# Patient Record
Sex: Female | Born: 2000 | Race: White | Hispanic: No | Marital: Single | Smoking: Current every day smoker
Health system: Southern US, Community
[De-identification: ages and names within clinical notes are randomized; demographics above are authoritative.]

## PROBLEM LIST (undated history)

## (undated) DIAGNOSIS — Z803 Family history of malignant neoplasm of breast: Secondary | ICD-10-CM

## (undated) DIAGNOSIS — Z789 Other specified health status: Secondary | ICD-10-CM

## (undated) HISTORY — DX: Family history of malignant neoplasm of breast: Z80.3

## (undated) HISTORY — PX: WISDOM TOOTH EXTRACTION: SHX21

## (undated) HISTORY — DX: Other specified health status: Z78.9

---
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
## 2018-09-27 ENCOUNTER — Ambulatory Visit: Payer: Medicaid Other | Admitting: Nurse Practitioner

## 2018-09-27 ENCOUNTER — Ambulatory Visit: Payer: Self-pay | Admitting: Nurse Practitioner

## 2018-09-30 ENCOUNTER — Encounter: Payer: Self-pay | Admitting: Nurse Practitioner

## 2018-09-30 ENCOUNTER — Ambulatory Visit: Payer: Self-pay | Admitting: Nurse Practitioner

## 2018-09-30 NOTE — Progress Notes (Signed)
Patient was a no-show to her new patient appointment.

## 2018-10-10 ENCOUNTER — Ambulatory Visit: Payer: Medicaid Other | Admitting: Nurse Practitioner

## 2018-10-10 ENCOUNTER — Encounter: Payer: Self-pay | Admitting: Nurse Practitioner

## 2018-10-10 ENCOUNTER — Other Ambulatory Visit: Payer: Self-pay

## 2018-11-14 DIAGNOSIS — N200 Calculus of kidney: Secondary | ICD-10-CM | POA: Diagnosis not present

## 2018-11-14 DIAGNOSIS — R1031 Right lower quadrant pain: Secondary | ICD-10-CM | POA: Diagnosis not present

## 2018-11-14 DIAGNOSIS — R682 Dry mouth, unspecified: Secondary | ICD-10-CM | POA: Diagnosis not present

## 2018-11-14 DIAGNOSIS — R1013 Epigastric pain: Secondary | ICD-10-CM | POA: Diagnosis not present

## 2018-11-14 DIAGNOSIS — Z87891 Personal history of nicotine dependence: Secondary | ICD-10-CM | POA: Diagnosis not present

## 2018-11-14 DIAGNOSIS — R1011 Right upper quadrant pain: Secondary | ICD-10-CM | POA: Diagnosis not present

## 2018-11-14 DIAGNOSIS — K529 Noninfective gastroenteritis and colitis, unspecified: Secondary | ICD-10-CM | POA: Diagnosis not present

## 2018-12-26 ENCOUNTER — Other Ambulatory Visit: Payer: Self-pay

## 2018-12-26 ENCOUNTER — Encounter: Payer: Self-pay | Admitting: Family Medicine

## 2018-12-26 ENCOUNTER — Ambulatory Visit (INDEPENDENT_AMBULATORY_CARE_PROVIDER_SITE_OTHER): Payer: Medicaid Other | Admitting: Family Medicine

## 2018-12-26 VITALS — BP 103/52 | HR 75 | Temp 98.3°F | Ht 66.0 in | Wt 179.4 lb

## 2018-12-26 DIAGNOSIS — Z7689 Persons encountering health services in other specified circumstances: Secondary | ICD-10-CM

## 2018-12-26 DIAGNOSIS — N926 Irregular menstruation, unspecified: Secondary | ICD-10-CM

## 2018-12-26 DIAGNOSIS — Z975 Presence of (intrauterine) contraceptive device: Secondary | ICD-10-CM | POA: Diagnosis not present

## 2018-12-26 DIAGNOSIS — Z308 Encounter for other contraceptive management: Secondary | ICD-10-CM

## 2018-12-26 NOTE — Progress Notes (Signed)
Subjective:    Patient ID: Jennifer Bass, female    DOB: Nov 15, 2000, 18 y.o.   MRN: [id number]  Jennifer Bass is a 18 y.o. female presenting on 12/26/2018 for Establish Care (want discuss birth control, anxiety issues that she think might be related to her birth control that shes currently on.)   HPI   Contraception Management / Abnormal Menstrual Cycle OB GYN History - She had pregnancy at August 2018 and resulted in C-section to deliver her son, 6 week later had Nexplanon placed at health dept. With anticipated removal date of August 2021 - Reports she is interested to get the Nexplanon removed. She describes difficulty with irregular and heavier menstrual cycles, she was placed on OCPs. Then she has just remained on Nexplanon only with still persistent abnormal bleeding, now not as heavy but seems to be more constant and irregular, she is ready to remove Nexplanon and discuss other options - She was concerned about IUD, one of her best friends had a Mirena IUD and had some issues with it, falling out or not working properly. - She is interested in Depo or Mirena or possibly OCP again but had issue adhering to those in past. - She would like to see GYN, previously at [facility]  Additional history Related to prior pregnancy and hormone changes she admits some associated anxiety she believes with Nexplanon and her current course. She says little things trigger this more, but overall thinks this is related. - She says in past she was never formally diagnosed with a postpartum depression but think she had some "down episodes" it occurred later, not immediately after pregnancy, her child is now over 72 years old, and she has done much better, she denies any active depression or mood symptoms. -She does feel overwhelmed at times. Attributes this to anxiety - she is not on anti depressant or anxiety med, and she declines now wants to pursue her contraception plan first  Health Maintenance:  Due for Flu Shot, declines today despite counseling on benefits   Depression screen [facility] 2/9 12/26/2018  Decreased Interest 0  Down, Depressed, Hopeless 0  PHQ - 2 Score 0   GAD 7 : Generalized Anxiety Score 12/26/2018  Nervous, Anxious, on Edge 2  Control/stop worrying 1  Worry too much - different things 1  Trouble relaxing 1  Restless 1  Easily annoyed or irritable 1  Afraid - awful might happen 1  Total GAD 7 Score 8  Anxiety Difficulty Somewhat difficult     History reviewed. No pertinent past medical history. Past Surgical History:  Procedure Laterality Date  . CESAREAN SECTION  09/2016   Social History   Socioeconomic History  . Marital status: Single    Spouse name: Not on file  . Number of children: Not on file  . Years of education: Not on file  . Highest education level: Not on file  Occupational History  . Not on file  Social Needs  . Financial resource strain: Not on file  . Food insecurity    Worry: Not on file    Inability: Not on file  . Transportation needs    Medical: Not on file    Non-medical: Not on file  Tobacco Use  . Smoking status: Never Smoker  . Smokeless tobacco: Never Used  Substance and Sexual Activity  . Alcohol use: No  . Drug use: Yes    Types: Marijuana  . Sexual activity: Not on file  Lifestyle  .  Physical activity    Days per week: Not on file    Minutes per session: Not on file  . Stress: Not on file  Relationships  . Social Musicianconnections    Talks on phone: Not on file    Gets together: Not on file    Attends religious service: Not on file    Active member of club or organization: Not on file    Attends meetings of clubs or organizations: Not on file    Relationship status: Not on file  . Intimate partner violence    Fear of current or ex partner: Not on file    Emotionally abused: Not on file    Physically abused: Not on file    Forced sexual activity: Not on file  Other Topics Concern  . Not on file  Social  History Narrative  . Not on file   History reviewed. No pertinent family history. Current Outpatient Medications on File Prior to Visit  Medication Sig  . etonogestrel (NEXPLANON) 68 MG IMPL implant 1 each by Subdermal route once.   No current facility-administered medications on file prior to visit.     Review of Systems Per HPI unless specifically indicated above      Objective:    BP (!) 103/52 (BP Location: Right Arm, Patient Position: Sitting, Cuff Size: Normal)   Pulse 75   Temp 98.3 F (36.8 C) (Oral)   Ht 5\' 6"  (1.676 m)   Wt 179 lb 6.4 oz (81.4 kg)   BMI 28.96 kg/m   Wt Readings from Last 3 Encounters:  12/26/18 179 lb 6.4 oz (81.4 kg)  04/15/17 160 lb (72.6 kg) (89 %, Z= 1.21)*  10/18/15 160 lb (72.6 kg) (91 %, Z= 1.31)*   * Growth percentiles are based on CDC (Girls, 2-20 Years) data.    Physical Exam Vitals signs and nursing note reviewed.  Constitutional:      General: She is not in acute distress.    Appearance: She is well-developed. She is not diaphoretic.     Comments: Well-appearing, comfortable, cooperative  HENT:     Head: Normocephalic and atraumatic.  Eyes:     General:        Right eye: No discharge.        Left eye: No discharge.     Conjunctiva/sclera: Conjunctivae normal.  Cardiovascular:     Rate and Rhythm: Normal rate.  Pulmonary:     Effort: Pulmonary effort is normal.  Skin:    General: Skin is warm and dry.     Findings: No erythema or rash.  Neurological:     Mental Status: She is alert and oriented to person, place, and time.  Psychiatric:        Behavior: Behavior normal.     Comments: Well groomed, good eye contact, normal speech and thoughts    Results for orders placed or performed during the hospital encounter of 10/18/15  Urinalysis complete, with microscopic  Result Value Ref Range   Color, Urine YELLOW (A) YELLOW   APPearance CLEAR (A) CLEAR   Glucose, UA NEGATIVE NEGATIVE mg/dL   Bilirubin Urine NEGATIVE  NEGATIVE   Ketones, ur 2+ (A) NEGATIVE mg/dL   Specific Gravity, Urine 1.026 1.005 - 1.030   Hgb urine dipstick NEGATIVE NEGATIVE   pH 5.0 5.0 - 8.0   Protein, ur 30 (A) NEGATIVE mg/dL   Nitrite NEGATIVE NEGATIVE   Leukocytes, UA NEGATIVE NEGATIVE   RBC / HPF 0-5 0 - 5 RBC/hpf  WBC, UA 0-5 0 - 5 WBC/hpf   Bacteria, UA NONE SEEN NONE SEEN   Squamous Epithelial / LPF 0-5 (A) NONE SEEN   Mucus PRESENT   Pregnancy, urine POC  Result Value Ref Range   Preg Test, Ur NEGATIVE NEGATIVE      Assessment & Plan:   Problem List Items Addressed This Visit    None    Visit Diagnoses    Abnormal menstrual cycle    -  Primary   Relevant Orders   Ambulatory referral to Obstetrics / Gynecology   Encounter for other contraceptive management       Relevant Orders   Ambulatory referral to Obstetrics / Gynecology   Encounter to establish care with new doctor       Nexplanon in place       Relevant Orders   Ambulatory referral to Obstetrics / Gynecology      Referral to [facility] (Preferred female provider) - for contraception management, age 18 year old female prior pregnancy at 65 C-section, she had Nexplanon in 09/2016, has had irregular menstrual and heavy bleeding and some hormonal mood swings/anxiety on this, and she requests removal of Nexplanon implant (was [facility]), and also she would like to discuss other contraception options such as Depo vs Mirena IUD   Recommend IUD for her given her goals, including regulating or reducing menstrual cycle bleeding. Also least side effect most effective and no adherence issues. She will consider this option.  Clinically with some mild associated anxiety uncertain if this is triggered by prior pregnancy or postpartum period adjustment / life stressors currently. Counseling provided on postpartum depression / general anxiety, and reviewed recommendations to be aware of these issues and bring them to our attention in future if  any changes or new concerns, and discussed that after management on contraception if she is ready to pursue any mood or anxiety related concerns we can re-discuss - Also can review with her new GYN as well if it is linked to postpartum period  Orders Placed This Encounter  Procedures  . Ambulatory referral to Obstetrics / Gynecology    Referral Priority:   Routine    Referral Type:   Consultation    Referral Reason:   Specialty Services Required    Requested Specialty:   Obstetrics and Gynecology    Number of Visits Requested:   1     No orders of the defined types were placed in this encounter.    Follow up plan: Return in about 1 year (around 12/26/2019) for Annual Physical.   Future scheduling TBD  Nobie Putnam, DO [facility] 12/26/2018, 10:34 AM

## 2018-12-26 NOTE — Patient Instructions (Addendum)
Thank you for coming to the office today.  Referral sent to GYN - stay tuned for phone call w/ apt - can call them or [place] in 1 week if not heard back.  I do recommend IUD Mirena or similar IUD options - it has least side effects and would be best to control or regular your menstrual cycle, they can discuss all options including Depo as well.   [facility]   Address: [address], [place], [place], [place], [place] [address] Hours: 8AM-5PM Phone: [phone]  Please schedule a Follow-up Appointment to: Return in about 1 year (around 12/26/2019) for Annual Physical.  If you have any other questions or concerns, please feel free to call the office or send a message through [facility]. You may also schedule an earlier appointment if necessary.  Additionally, you may be receiving a survey about your experience at our office within a few days to 1 week by e-mail or mail. We value your feedback.  Nobie Putnam, DO [facility]

## 2019-01-11 NOTE — Progress Notes (Signed)
Jennifer Hauser, Jennifer Bass   Chief Complaint  Patient presents with  . Contraception    Nexplanon removal, interested in [facility]    HPI:      Jennifer Bass is a 18 y.o. No obstetric history on file. who LMP was No LMP recorded. Patient has had an implant., presents today for NP [facility] consult and nexplanon removal, referred by PCP. Nexplanon placed PP 8/18; irreg bleeding since. Tried BCP to control cycle without relief. Would like it removed and wants to try something else. Did OCPs in past but forgot to take daily and got pregnant. No hx of HTN, DVTs, migraines with aura. She is occas sex active.   There are no active problems to display for this patient.   Past Surgical History:  Procedure Laterality Date  . CESAREAN SECTION  09/2016  . WISDOM TOOTH EXTRACTION      Family History  Problem Relation Age of Onset  . Breast cancer Maternal Aunt        65s  . Breast cancer Other        2 Aunts, both deceased  . Breast cancer Maternal Great-grandfather     Social History   Socioeconomic History  . Marital status: Single    Spouse name: Not on file  . Number of children: Not on file  . Years of education: Not on file  . Highest education level: Not on file  Occupational History  . Not on file  Social Needs  . Financial resource strain: Not on file  . Food insecurity    Worry: Not on file    Inability: Not on file  . Transportation needs    Medical: Not on file    Non-medical: Not on file  Tobacco Use  . Smoking status: Never Smoker  . Smokeless tobacco: Never Used  Substance and Sexual Activity  . Alcohol use: No  . Drug use: Yes    Types: Marijuana  . Sexual activity: Yes    Birth control/protection: Implant  Lifestyle  . Physical activity    Days per week: Not on file    Minutes per session: Not on file  . Stress: Not on file  Relationships  . Social Herbalist on phone: Not on file    Gets together: Not on file    Attends religious  service: Not on file    Active member of club or organization: Not on file    Attends meetings of clubs or organizations: Not on file    Relationship status: Not on file  . Intimate partner violence    Fear of current or ex partner: Not on file    Emotionally abused: Not on file    Physically abused: Not on file    Forced sexual activity: Not on file  Other Topics Concern  . Not on file  Social History Narrative  . Not on file    Outpatient Medications Prior to Visit  Medication Sig Dispense Refill  . etonogestrel (NEXPLANON) 68 MG IMPL implant 1 each by Subdermal route once.     No facility-administered medications prior to visit.       ROS:  Review of Systems  Constitutional: Negative for fever.  Gastrointestinal: Negative for blood in stool, constipation, diarrhea, nausea and vomiting.  Genitourinary: Positive for vaginal bleeding. Negative for dyspareunia, dysuria, flank pain, frequency, hematuria, urgency, vaginal discharge and vaginal pain.  Musculoskeletal: Negative for back pain.  Skin: Negative for rash.  BREAST: No symptoms   OBJECTIVE:   Vitals:  BP 100/60   Ht 5\' 6"  (1.676 m)   Wt 180 lb (81.6 kg)   BMI 29.05 kg/m   Physical Exam Vitals signs reviewed.  Constitutional:      Appearance: She is well-developed.  Neck:     Musculoskeletal: Normal range of motion.  Pulmonary:     Effort: Pulmonary effort is normal.  Musculoskeletal: Normal range of motion.  Skin:    General: Skin is warm and dry.  Neurological:     General: No focal deficit present.     Mental Status: She is alert and oriented to person, place, and time.     Cranial Nerves: No cranial nerve deficit.  Psychiatric:        Mood and Affect: Mood normal.        Behavior: Behavior normal.        Thought Content: Thought content normal.        Judgment: Judgment normal.    Nexplanon removal Procedure note - The Nexplanon was noted in the patient's arm and the end was identified. The  skin was cleansed with a Betadine solution. A small injection of subcutaneous lidocaine with epinephrine was given over the end of the implant. An incision was made at the end of the implant. The rod was noted in the incision and grasped with a hemostat. It was noted to be intact.  Steri-Strip was placed approximating the incision. Hemostasis was noted.   Assessment/Plan: Encounter for initial prescription of injectable contraceptive - Plan: medroxyPROGESTERone Acetate 150 MG/ML SUSY; BC options discussed. Pt would like to try depo. Also considered xulane. Rx eRxd. RTO for injection after neg UPT. Condoms.   Nexplanon removal   Meds ordered this encounter  Medications  . medroxyPROGESTERone Acetate 150 MG/ML SUSY    Sig: Inject 1 mL (150 mg total) into the muscle once for 1 dose.    Dispense:  1 mL    Refill:  3    Order Specific Question:   Supervising Provider    Answer:   Nadara Mustard    Plan:   She was told to remove the dressing in 12-24 hours, to keep the incision area dry for 24 hours and to remove the Steristrip in 2-3  days.  Notify [birth year]-1992 if any signs of tenderness, redness, pain, or fevers develop.    Return if symptoms worsen or fail to improve.  Zeddie Njie B. Daelyn Pettaway, PA-C 01/12/2019 10:02 AM

## 2019-01-11 NOTE — Patient Instructions (Addendum)
I value your feedback and entrusting us with your care. If you get a [facility] patient survey, I would appreciate you taking the time to let us know about your experience today. Thank you!  Remove the dressing in 24 hours,  keep the incision area dry for 24 hours and remove the Steristrip in 2-3  days.  Notify us if any signs of tenderness, redness, pain, or fevers develop.  

## 2019-01-12 ENCOUNTER — Ambulatory Visit (INDEPENDENT_AMBULATORY_CARE_PROVIDER_SITE_OTHER): Payer: Medicaid Other | Admitting: Obstetrics and Gynecology

## 2019-01-12 ENCOUNTER — Encounter: Payer: Self-pay | Admitting: Obstetrics and Gynecology

## 2019-01-12 ENCOUNTER — Other Ambulatory Visit: Payer: Self-pay

## 2019-01-12 VITALS — BP 100/60 | Ht 66.0 in | Wt 180.0 lb

## 2019-01-12 DIAGNOSIS — Z3046 Encounter for surveillance of implantable subdermal contraceptive: Secondary | ICD-10-CM

## 2019-01-12 DIAGNOSIS — Z3049 Encounter for surveillance of other contraceptives: Secondary | ICD-10-CM

## 2019-01-12 DIAGNOSIS — Z30013 Encounter for initial prescription of injectable contraceptive: Secondary | ICD-10-CM

## 2019-01-12 MED ORDER — MEDROXYPROGESTERONE ACETATE 150 MG/ML IM SUSY
150.0000 mg | PREFILLED_SYRINGE | Freq: Once | INTRAMUSCULAR | 3 refills | Status: DC
Start: 2019-01-12 — End: 2021-08-11

## 2019-01-17 ENCOUNTER — Telehealth: Payer: Self-pay

## 2019-01-17 NOTE — Telephone Encounter (Signed)
Called pt back, advised to yes definitely take the bandaid off. She mentioned steri strips came off along with big bandaid. The cramping she mentioned she normally has with her period, which she still is. Has noticed her period is a little heavier than usual. Advised a really heavy period where she has to change her tampon more than once in less than a hour, she should contact us or head to ER.

## 2019-01-17 NOTE — Telephone Encounter (Signed)
Pt called after hour nurse 01/16/19 10:47pm stating she jut had her nexplanon removed; wants to know if she has to change the bandaid; is having cramping too and is wanting to make sure that is normal.  [phone]

## 2019-02-25 ENCOUNTER — Ambulatory Visit
Admission: EM | Admit: 2019-02-25 | Discharge: 2019-02-25 | Disposition: A | Discharge status: Home / Self Care | Payer: Medicaid Other | Attending: Emergency Medicine | Admitting: Emergency Medicine

## 2019-02-25 ENCOUNTER — Other Ambulatory Visit: Payer: Self-pay

## 2019-02-25 DIAGNOSIS — Z20822 Contact with and (suspected) exposure to covid-19: Secondary | ICD-10-CM | POA: Diagnosis not present

## 2019-02-25 NOTE — Discharge Instructions (Addendum)
Advised to retest in 3 to 4 days to make sure that you do not have Covid.  Sometimes the test does not turn out positive until 5 to 8 days post exposure.

## 2019-02-25 NOTE — ED Provider Notes (Signed)
HPI  SUBJECTIVE:  Jennifer Bass is a 19 y.o. female who presents for Covid testing.  Last known exposure was 3 days ago.  She has no complaints.  She denies fevers, body aches, headaches, nasal congestion, sore throat, fatigue, cough, shortness of breath, nausea, vomiting, diarrhea, abdominal pain.  She vapes.  No tobacco use.  No pulmonary disease, coronary disease, diabetes, hypertension, chronic kidney disease, HIV, cancer, immunocompromise.  PMD: [facility].    Past Medical History:  Diagnosis Date  . Family history of breast cancer    qualifies for cancer genetic testing age 77    Past Surgical History:  Procedure Laterality Date  . CESAREAN SECTION  09/2016  . WISDOM TOOTH EXTRACTION      Family History  Problem Relation Age of Onset  . Breast cancer Maternal Aunt        30s  . Breast cancer Other        2 Aunts, both deceased  . Breast cancer Maternal Great-grandfather     Social History   Tobacco Use  . Smoking status: Never Smoker  . Smokeless tobacco: Never Used  Substance Use Topics  . Alcohol use: No  . Drug use: Yes    Types: Marijuana    No current facility-administered medications for this encounter.  Current Outpatient Medications:  .  medroxyPROGESTERone Acetate 150 MG/ML SUSY, Inject 1 mL (150 mg total) into the muscle once for 1 dose., Disp: 1 mL, Rfl: 3  Allergies  Allergen Reactions  . Hydrocodone Rash     ROS  As noted in HPI.   Physical Exam  BP (!) 136/100 (BP Location: Left Arm)   Pulse 82   Temp 98.3 F (36.8 C) (Oral)   Resp 18   Ht 5\' 6"  (1.676 m)   Wt 77.1 kg   LMP 02/04/2019   SpO2 100%   BMI 27.44 kg/m   Constitutional: Well developed, well nourished, no acute distress Eyes:  EOMI, conjunctiva normal bilaterally HENT: Normocephalic, atraumatic,mucus membranes moist Respiratory: Normal inspiratory effort lungs clear bilaterally Cardiovascular: Normal rate regular rhythm no murmurs rubs or  gallops GI: nondistended skin: No rash, skin intact Musculoskeletal: no deformities Neurologic: Alert & oriented x 3, no focal neuro deficits Psychiatric: Speech and behavior appropriate   ED Course   Medications - No data to display  Orders Placed This Encounter  Procedures  . Novel Coronavirus, NAA (Hosp order, Send-out to Ref Lab; TAT 18-24 hrs    Standing Status:   Standing    Number of Occurrences:   1    Order Specific Question:   Is this test for diagnosis or screening    Answer:   Screening    Order Specific Question:   Symptomatic for COVID-19 as defined by CDC    Answer:   No    Order Specific Question:   Hospitalized for COVID-19    Answer:   No    Order Specific Question:   Admitted to ICU for COVID-19    Answer:   No    Order Specific Question:   Previously tested for COVID-19    Answer:   No    Order Specific Question:   Resident in a congregate (group) care setting    Answer:   No    Order Specific Question:   Employed in healthcare setting    Answer:   No    Order Specific Question:   Pregnant    Answer:   No  No results found for this or any previous visit (from the past 24 hour(s)). No results found.  ED Clinical Impression  1. Close exposure to COVID-19 virus   2. Encounter for laboratory testing for COVID-19 virus      ED Assessment/Plan  Covid PCR sent.  Patient asymptomatic.  Advised patient it may be too early and that this may be a false negative.  Advised her to retest in 4 to 5 days, seek medical care sooner if she becomes symptomatic.  sNo orders of the defined types were placed in this encounter.   *This clinic note was created using Dragon dictation software. Therefore, there may be occasional mistakes despite careful proofreading.   ?    Melynda Ripple, MD 02/26/19 [phone]

## 2019-02-25 NOTE — ED Triage Notes (Signed)
Patient states that she is here for Covid Testing. Reports that one of her co-workers is positive. Patient currently with no symptoms.

## 2019-02-26 LAB — NOVEL CORONAVIRUS, NAA (HOSP ORDER, SEND-OUT TO REF LAB; TAT 18-24 HRS): SARS-CoV-2, NAA: NOT DETECTED

## 2019-05-31 ENCOUNTER — Other Ambulatory Visit: Payer: Self-pay

## 2019-05-31 ENCOUNTER — Ambulatory Visit (INDEPENDENT_AMBULATORY_CARE_PROVIDER_SITE_OTHER): Payer: Medicaid Other | Admitting: Family Medicine

## 2019-05-31 ENCOUNTER — Encounter: Payer: Self-pay | Admitting: Family Medicine

## 2019-05-31 ENCOUNTER — Ambulatory Visit: Payer: Self-pay | Admitting: *Deleted

## 2019-05-31 VITALS — BP 82/52 | HR 85 | Temp 97.5°F | Resp 16 | Ht 66.0 in | Wt 170.0 lb

## 2019-05-31 DIAGNOSIS — L239 Allergic contact dermatitis, unspecified cause: Secondary | ICD-10-CM

## 2019-05-31 MED ORDER — TRIAMCINOLONE ACETONIDE 0.5 % EX CREA
1.0000 | TOPICAL_CREAM | Freq: Two times a day (BID) | CUTANEOUS | 1 refills | Status: DC
Start: 2019-05-31 — End: 2021-01-13

## 2019-05-31 MED ORDER — HYDROXYZINE HCL 10 MG PO TABS: 10.0000 mg | ORAL_TABLET | Freq: Three times a day (TID) | ORAL | 1 refills | Status: DC | PRN
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
## 2019-05-31 NOTE — Patient Instructions (Addendum)
Thank you for coming to the office today.  Irritated red skin can be from allergic reaction / chemical burn from product  Use the topical steroid creams twice a day for up to 1 week, maximum duration of use per one flare is 10 to 14 days, then STOP using it and allow skin to recover. Caution with over-use may cause lightening of the skin.   For baths/showers, limit bathing to every other day if you can (max 1 x daily)  Use a gentle, unscented soap and lukewarm water (hot water is most irritating to skin) Never scrub skin with too much pressure, this causes more irritation. Pat skin dry, then leave it slightly damp. DO NOT scrub it dry. Apply steroid cream to skin and rub in all the way, wait 15 min, then apply a daily moisturizer (Vaseline, Eucerin, Aveeno). Continue daily moisturizer every day of the year (even after flare is resolved)  Use anti itch pill hydroxyzine as needed 1-2 pills every 8 hours or 3 times a day.  MAY also use a topical antibiotic ointment - neosporin if needed throughout the day - but only use AFTER the steroid is absorbed into scalp.  IF NOT IMPROVED in 48 hours - call or message and we can consider oral steroid prednisone or oral antibiotic if signs of infection.  If develops redness, honey colored crust oozing, drainage of pus, bleeding, or redness / swelling, pain, please return for re-evaluation, may have become infected after scratching.   Please schedule a Follow-up Appointment to: Return in about 2 days (around 06/02/2019), or if symptoms worsen or fail to improve, for allergic reaction.  If you have any other questions or concerns, please feel free to call the office or send a message through MyChart. You may also schedule an earlier appointment if necessary.  Additionally, you may be receiving a survey about your experience at our office within a few days to 1 week by e-mail or mail. We value your feedback.  Saralyn Pilar, DO [facility], [place]
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
## 2019-07-31 ENCOUNTER — Ambulatory Visit: Payer: Medicaid Other | Admitting: Family Medicine

## 2019-08-04 ENCOUNTER — Telehealth: Payer: Medicaid Other

## 2019-10-27 ENCOUNTER — Other Ambulatory Visit: Payer: Self-pay

## 2019-10-27 ENCOUNTER — Telehealth (INDEPENDENT_AMBULATORY_CARE_PROVIDER_SITE_OTHER): Payer: Medicaid Other | Admitting: Family Medicine

## 2019-10-27 ENCOUNTER — Encounter: Payer: Self-pay | Admitting: Family Medicine

## 2019-10-27 DIAGNOSIS — J019 Acute sinusitis, unspecified: Secondary | ICD-10-CM

## 2019-10-27 DIAGNOSIS — Z20822 Contact with and (suspected) exposure to covid-19: Secondary | ICD-10-CM

## 2019-10-27 MED ORDER — IPRATROPIUM BROMIDE 0.06 % NA SOLN
2.0000 | Freq: Four times a day (QID) | NASAL | 0 refills | Status: DC
Start: 2019-10-27 — End: 2019-11-06

## 2019-10-27 NOTE — Patient Instructions (Addendum)
Start Atrovent nasal spray decongestant 2 sprays in each nostril up to 4 times daily for 7 days  COVID19 swab here at office today 4:15pm  Stay tuned, recommend quarantine as advised  Follow up if not resolved by next week can treat further as sinus infection if not improve  Please schedule a Follow-up Appointment to: Return in about 1 week (around 11/03/2019), or if symptoms worsen or fail to improve, for sinusitis / vs covid.  If you have any other questions or concerns, please feel free to call the office or send a message through MyChart. You may also schedule an earlier appointment if necessary.  Additionally, you may be receiving a survey about your experience at our office within a few days to 1 week by e-mail or mail. We value your feedback.  Saralyn Pilar, DO [facility], [place]

## 2019-10-27 NOTE — Progress Notes (Signed)
Subjective:    Patient ID: Jennifer Bass, female    DOB: 2000/08/26, 19 y.o.   MRN: [id number]  Jennifer Bass is a 19 y.o. female presenting on 10/27/2019 for Sore Throat (nasal congestion, cough  onset two days denies fever --SOB--or chills or bodyache)  Virtual / Telehealth Encounter - Video Visit via MyChart The purpose of this virtual visit is to provide medical care while limiting exposure to the novel coronavirus (COVID19) for both patient and office staff.  Consent was obtained for remote visit:  Yes.   Answered questions that patient had about telehealth interaction:  Yes.   I discussed the limitations, risks, security and privacy concerns of performing an evaluation and management service by video/telephone. I also discussed with the patient that there may be a patient responsible charge related to this service. The patient expressed understanding and agreed to proceed.  Patient Location: Home Provider Location: [facility] (Office)   HPI   Acute Rhinosinusitis Possible COVID19 infection Sore throat  Reports symptoms started few days ago with sore throat sinus drainage causing post nasal drainage, gradual worse - Took OTC cold medicine PRN some temporary relief. Takes DayQuil and Allergy nasal spray - Currently at home with baby, asymptomatic No other fam member sick Denies any dyspnea, coughing, wheezing, headache, change or loss of taste / smell, nausea vomiting diarrhea  Health Maintenance: She is not vaccinated against COVID19.  Depression screen PHQ 2/9 12/26/2018  Decreased Interest 0  Down, Depressed, Hopeless 0  PHQ - 2 Score 0    Social History   Tobacco Use  . Smoking status: Never Smoker  . Smokeless tobacco: Never Used  Vaping Use  . Vaping Use: Every day  . Substances: Nicotine, Flavoring  Substance Use Topics  . Alcohol use: No  . Drug use: Yes    Types: Marijuana    Review of Systems Per HPI unless specifically indicated  above     Objective:    There were no vitals taken for this visit.  Wt Readings from Last 3 Encounters:  05/31/19 170 lb (77.1 kg)  02/25/19 170 lb (77.1 kg)  01/12/19 180 lb (81.6 kg)    Physical Exam   Note examination was completely remotely via video observation objective data only  Gen - well-appearing, no acute distress or apparent pain, comfortable HEENT - eyes appear clear without discharge or redness Heart/Lungs - cannot examine virtually - observed no evidence of coughing or labored breathing. Skin - face visible today- no rash Neuro - awake, alert, oriented Psych - not anxious appearing   Results for orders placed or performed during the hospital encounter of 02/25/19  Novel Coronavirus, NAA (Hosp order, Send-out to Ref Lab; TAT 18-24 hrs   Specimen: Nasopharyngeal Swab; Respiratory  Result Value Ref Range   SARS-CoV-2, NAA NOT DETECTED NOT DETECTED   Coronavirus Source NASOPHARYNGEAL       Assessment & Plan:   Problem List Items Addressed This Visit    None    Visit Diagnoses    Acute rhinosinusitis    -  Primary   Relevant Medications   ipratropium (ATROVENT) 0.06 % nasal spray   Suspected COVID-19 virus infection       Relevant Orders   Novel Coronavirus, NAA ([facility])      Consistent with acute frontal rhinosinusitis, likely initially viral URI vs allergic rhinitis component Unvaccinated to COVID, at risk for infection cannot rule out. No other sick contacts Afebrile, no dyspnea  Plan:  1. Reassurance, likely self-limited - no indication for antibiotics at this time 2. Start Atrovent nasal spray decongestant 2 sprays in each nostril up to 4 times daily for 7 days 3. Continue cold medicine OTC, allergy meds, CAUTION with Mucinex/Afrin nasal spray limit overuse avoid rebound  COVID Swab [facility] - ordered today, return to [facility] 4:15pm today for drive up covid swab  Return criteria reviewed  If unresolved by next week, call or message, we can  consider other treatment for sinusitis such as antibiotic if unresolved.   Orders Placed This Encounter  Procedures  . Novel Coronavirus, NAA ([facility])    Order Specific Question:   Is this test for diagnosis or screening    Answer:   Diagnosis of ill patient    Order Specific Question:   Symptomatic for COVID-19 as defined by CDC    Answer:   Yes    Order Specific Question:   Date of Symptom Onset    Answer:   10/25/2019    Order Specific Question:   Hospitalized for COVID-19    Answer:   No    Order Specific Question:   Admitted to ICU for COVID-19    Answer:   No    Order Specific Question:   Previously tested for COVID-19    Answer:   No    Order Specific Question:   Resident in a congregate (group) care setting    Answer:   No    Order Specific Question:   Is the patient student?    Answer:   No    Order Specific Question:   Employed in healthcare setting    Answer:   No    Order Specific Question:   Pregnant    Answer:   No    Order Specific Question:   Has patient completed COVID vaccination(s) (2 doses of Pfizer/Moderna 1 dose of [employer])    Answer:   No    Order Specific Question:   Release to patient    Answer:   Immediate     Meds ordered this encounter  Medications  . ipratropium (ATROVENT) 0.06 % nasal spray    Sig: Place 2 sprays into both nostrils 4 (four) times daily. For up to 5-7 days then stop.    Dispense:  15 mL    Refill:  0     Follow up plan: Return in about 1 week (around 11/03/2019), or if symptoms worsen or fail to improve, for sinusitis / vs covid.    Patient verbalizes understanding with the above medical recommendations including the limitation of remote medical advice.  Specific follow-up and call-back criteria were given for patient to follow-up or seek medical care more urgently if needed.  Total duration of direct patient care provided via video conference: 10 minutes  Saralyn Pilar, DO [facility] 10/27/2019, 9:48 AM

## 2019-11-06 ENCOUNTER — Telehealth: Payer: Self-pay | Admitting: Family Medicine

## 2019-11-06 DIAGNOSIS — J019 Acute sinusitis, unspecified: Secondary | ICD-10-CM

## 2019-11-06 MED ORDER — PREDNISONE 20 MG PO TABS: 40.0000 mg | ORAL_TABLET | Freq: Every day | ORAL | 0 refills | Status: DC

## 2019-11-06 NOTE — Telephone Encounter (Signed)
Pt called stating that she is experiencing sinus pressure. She states that since her visit with PCP on 10/1719 she has felt a lot better, but the sinus pressure in her ears will not go away. Pt is requesting to have something sent in for her or to talk to a nurse or PCP. Please advise.       [facility] - [place], [place] - [address]  [address] [place] [address]  Phone: [phone] Fax: [phone]  Hours: Not open 24 hours

## 2019-11-06 NOTE — Telephone Encounter (Signed)
Called her. Will send prednisone 20mg  x 2 = 40mg  daily x 3 days with meal for sinus pressure, eustachian tube dysfunction pressure, no sign bacterial sinusitis at this time, she declines nasal steroid  , DO [facility] [place] [facility] 11/06/2019, 6:21 PM

## 2020-01-12 ENCOUNTER — Ambulatory Visit: Payer: Self-pay

## 2020-02-11 ENCOUNTER — Other Ambulatory Visit: Payer: Self-pay

## 2020-02-11 ENCOUNTER — Encounter: Payer: Self-pay | Admitting: Emergency Medicine

## 2020-02-11 DIAGNOSIS — F419 Anxiety disorder, unspecified: Secondary | ICD-10-CM | POA: Diagnosis not present
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
## 2020-02-12 ENCOUNTER — Emergency Department
Admission: EM | Admit: 2020-02-12 | Discharge: 2020-02-12 | Disposition: A | Discharge status: Home / Self Care | Payer: Medicaid Other | Attending: Emergency Medicine | Admitting: Emergency Medicine

## 2020-02-12 DIAGNOSIS — F419 Anxiety disorder, unspecified: Secondary | ICD-10-CM

## 2020-02-12 MED ORDER — LORAZEPAM 1 MG PO TABS: 1.0000 mg | ORAL_TABLET | Freq: Three times a day (TID) | ORAL | 0 refills | Status: DC | PRN

## 2020-02-12 MED ORDER — LORAZEPAM 1 MG PO TABS
1.0000 mg | ORAL_TABLET | Freq: Once | ORAL | Status: DC
  Filled 2020-02-12: qty 1

## 2020-02-12 NOTE — ED Provider Notes (Signed)
[facility] Emergency Department Provider Note   ____________________________________________   Event Date/Time   First MD Initiated Contact with Patient 02/12/20 [phone]     (approximate)  I have reviewed the triage vital signs and the nursing notes.   HISTORY  Chief Complaint Anxiety    HPI Jennifer Bass is a 20 y.o. female who presents to the ED from home with a chief complaint of anxiety.  Patient has a history of anxiety, not currently taking medications nor seeing a counselor.  She has a particular fear of thunderstorms.  She was alone tonight when a thunderstorm started which made her panic.  Complains of nausea associated with anxiety but that has since resolved.  Denies fever, cough, chest pain, shortness of breath, abdominal pain, nausea, vomiting or dizziness.  Denies active SI/HI/AH/VH.     Past Medical History:  Diagnosis Date  . Family history of breast cancer    qualifies for cancer genetic testing age 57    There are no problems to display for this patient.   Past Surgical History:  Procedure Laterality Date  . CESAREAN SECTION  09/2016  . WISDOM TOOTH EXTRACTION      Prior to Admission medications   Medication Sig Start Date End Date Taking? Authorizing Provider  LORazepam (ATIVAN) 1 MG tablet Take 1 tablet (1 mg total) by mouth every 8 (eight) hours as needed for anxiety. 02/12/20  Yes Irean Hong, MD  hydrOXYzine (ATARAX/VISTARIL) 10 MG tablet Take 1-2 tablets (10-20 mg total) by mouth every 8 (eight) hours as needed for itching. 05/31/19   Althea Charon, Netta Neat, DO  medroxyPROGESTERone Acetate 150 MG/ML SUSY Inject 1 mL (150 mg total) into the muscle once for 1 dose. 01/12/19 01/12/19  Copland, Ilona Sorrel, PA-C  predniSONE (DELTASONE) 20 MG tablet Take 2 tablets (40 mg total) by mouth daily with breakfast. For 3 days 11/06/19   Smitty Cords, DO  triamcinolone cream (KENALOG) 0.5 % Apply 1 application topically 2 (two)  times daily. To affected areas, for up to 2 weeks. 05/31/19   Karamalegos, Netta Neat, DO  etonogestrel (NEXPLANON) 68 MG IMPL implant 1 each by Subdermal route once.  02/25/19  [provider]    Allergies Hydrocodone  Family History  Problem Relation Age of Onset  . Breast cancer Maternal Aunt        30s  . Breast cancer Other        2 Aunts, both deceased  . Breast cancer Maternal Great-grandfather     Social History Social History   Tobacco Use  . Smoking status: Never Smoker  . Smokeless tobacco: Never Used  Vaping Use  . Vaping Use: Every day  . Substances: Nicotine, Flavoring  Substance Use Topics  . Alcohol use: Yes    Comment: occ  . Drug use: Yes    Types: Marijuana    Review of Systems  Constitutional: No fever/chills Eyes: No visual changes. ENT: No sore throat. Cardiovascular: Denies chest pain. Respiratory: Denies shortness of breath. Gastrointestinal: No abdominal pain.  No nausea, no vomiting.  No diarrhea.  No constipation. Genitourinary: Negative for dysuria. Musculoskeletal: Negative for back pain. Skin: Negative for rash. Neurological: Negative for headaches, focal weakness or numbness. Psychiatric:  Positive for anxiety.  ____________________________________________   PHYSICAL EXAM:  VITAL SIGNS: ED Triage Vitals  Enc Vitals Group     BP 02/11/20 2209 118/62     Pulse Rate 02/11/20 2209 (!) 54     Resp  02/11/20 2209 18     Temp 02/11/20 2209 97.7 F (36.5 C)     Temp Source 02/11/20 2209 Oral     SpO2 02/11/20 2209 99 %     Weight 02/11/20 2210 175 lb (79.4 kg)     Height 02/11/20 2210 5\' 6"  (1.676 m)     Head Circumference --      Peak Flow --      Pain Score 02/11/20 2210 7     Pain Loc --      Pain Edu? --      Excl. in GC? --     Constitutional: Alert and oriented. Well appearing and in no acute distress. Eyes: Conjunctivae are normal. PERRL. EOMI. Head: Atraumatic. Nose: No  congestion/rhinnorhea. Mouth/Throat: Mucous membranes are moist.   Neck: No stridor.   Cardiovascular: Normal rate, regular rhythm. Grossly normal heart sounds.  Good peripheral circulation. Respiratory: Normal respiratory effort.  No retractions. Lungs CTAB. Gastrointestinal: Soft and nontender. No distention. No abdominal bruits. No CVA tenderness. Musculoskeletal: No lower extremity tenderness nor edema.  No joint effusions. Neurologic:  Normal speech and language. No gross focal neurologic deficits are appreciated. No gait instability. Skin:  Skin is warm, dry and intact. No rash noted. Psychiatric: Mood and affect are mildly anxious. Speech and behavior are normal.  ____________________________________________   LABS (all labs ordered are listed, but only abnormal results are displayed)  Labs Reviewed - No data to display ____________________________________________  EKG  None ____________________________________________  RADIOLOGY I, Cayleigh Paull J, personally viewed and evaluated these images (plain radiographs) as part of my medical decision making, as well as reviewing the written report by the radiologist.  ED MD interpretation: None  Official radiology report(s): No results found.  ____________________________________________   PROCEDURES  Procedure(s) performed (including Critical Care):  Procedures   ____________________________________________   INITIAL IMPRESSION / ASSESSMENT AND PLAN / ED COURSE  As part of my medical decision making, I reviewed the following data within the electronic MEDICAL RECORD NUMBER Nursing notes reviewed and incorporated, Old chart reviewed and Notes from prior ED visits     20 year old female with a history of anxiety presenting with heightened anxiety secondary to thunderstorm.  Voices no complaints of SI/HI/AH/VH.  Will administer and prescribe 1mg  Lorazepam, and refer to [facility] for outpatient follow-up.  Strict return precautions  given.  Patient verbalizes understanding agrees with plan of care      ____________________________________________   FINAL CLINICAL IMPRESSION(S) / ED DIAGNOSES  Final diagnoses:  Anxiety     ED Discharge Orders         Ordered    LORazepam (ATIVAN) 1 MG tablet  Every 8 hours PRN        02/12/20 0100          *Please note:  Liviya Santini Birch was evaluated in Emergency Department on 02/12/2020 for the symptoms described in the history of present illness. She was evaluated in the context of the global COVID-19 pandemic, which necessitated consideration that the patient might be at risk for infection with the SARS-CoV-2 virus that causes COVID-19. Institutional protocols and algorithms that pertain to the evaluation of patients at risk for COVID-19 are in a state of rapid change based on information released by regulatory bodies including the CDC and federal and state organizations. These policies and algorithms were followed during the patient's care in the ED.  Some ED evaluations and interventions may be delayed as a result of limited staffing during and the pandemic.*  Note:  This document was prepared using Dragon voice recognition software and may include unintentional dictation errors.   Paulette Blanch, MD 02/12/20 [phone]

## 2020-02-12 NOTE — Discharge Instructions (Signed)
1.  You may take Lorazepam 1 mg every 8 hours as needed for anxiety. 2.  Return to the ER for worsening symptoms, feelings of hurting yourself or others, or other concerns.

## 2020-02-12 NOTE — ED Notes (Signed)
Pt tearful and does not feel safe at this time to leave. Pt denies SI and HI. Writer and MD agree to have pt stay in recliner until AM for observation of mood/actions.

## 2020-02-13 ENCOUNTER — Telehealth: Payer: Self-pay | Admitting: *Deleted

## 2020-02-13 NOTE — Telephone Encounter (Signed)
Transition Care Management Unsuccessful Follow-up Telephone Call  Date of discharge and from where:  02/12/20 [facility]  Attempts:  1st Attempt  Reason for unsuccessful TCM follow-up call:  No answer/busy

## 2020-02-15 NOTE — Telephone Encounter (Signed)
Transition Care Management Unsuccessful Follow-up Telephone Call  Date of discharge and from where:  02/12/20 [facility] ED  Attempts:  2nd Attempt  Reason for unsuccessful TCM follow-up call:  Unable to reach patient

## 2020-02-16 NOTE — Telephone Encounter (Signed)
Transition Care Management Unsuccessful Follow-up Telephone Call  Date of discharge and from where:  02/12/20 [facility] ED  Attempts:  3rd Attempt  Reason for unsuccessful TCM follow-up call:  Unable to reach patient

## 2020-02-20 ENCOUNTER — Other Ambulatory Visit: Payer: Self-pay

## 2020-02-20 ENCOUNTER — Other Ambulatory Visit: Payer: Medicaid Other

## 2020-02-20 DIAGNOSIS — Z20822 Contact with and (suspected) exposure to covid-19: Secondary | ICD-10-CM | POA: Diagnosis not present

## 2020-02-21 ENCOUNTER — Encounter: Payer: Self-pay | Admitting: Family Medicine

## 2020-02-21 ENCOUNTER — Telehealth (INDEPENDENT_AMBULATORY_CARE_PROVIDER_SITE_OTHER): Payer: Medicaid Other | Admitting: Family Medicine

## 2020-02-21 ENCOUNTER — Other Ambulatory Visit: Payer: Self-pay

## 2020-02-21 VITALS — Ht 66.0 in | Wt 160.0 lb

## 2020-02-21 DIAGNOSIS — Z20822 Contact with and (suspected) exposure to covid-19: Secondary | ICD-10-CM | POA: Diagnosis not present

## 2020-02-21 DIAGNOSIS — R52 Pain, unspecified: Secondary | ICD-10-CM | POA: Diagnosis not present

## 2020-02-21 DIAGNOSIS — R509 Fever, unspecified: Secondary | ICD-10-CM

## 2020-02-21 LAB — NOVEL CORONAVIRUS, NAA: SARS-CoV-2, NAA: DETECTED — AB

## 2020-02-21 LAB — SARS-COV-2, NAA 2 DAY TAT

## 2020-02-21 NOTE — Patient Instructions (Addendum)
Clinically viral syndrome flu-like symptoms, cannot rule out COVID Unvaccinated to covid and flu Symptom onset 02/19/20 Pending COVID test from 02/20/20 - awaiting result.  Reassurance given, mild symptoms currently, low grade temp, improved on OTC medications Continue symptom management OTC Declined further rx for symptoms F/u test result - see below, may offer flu treatment if covid negative Quarantine rules reviewed for covid, leave quarantine after 5 day from positive test if fever free off medication  Please schedule a Follow-up Appointment to: Return if symptoms worsen or fail to improve.  If you have any other questions or concerns, please feel free to call the office or send a message through MyChart. You may also schedule an earlier appointment if necessary.  Additionally, you may be receiving a survey about your experience at our office within a few days to 1 week by e-mail or mail. We value your feedback.  Saralyn Pilar, DO [facility], [place]

## 2020-02-21 NOTE — Progress Notes (Signed)
Virtual Visit via Telephone The purpose of this virtual visit is to provide medical care while limiting exposure to the novel coronavirus (COVID19) for both patient and office staff.  Consent was obtained for phone visit:  Yes.   Answered questions that patient had about telehealth interaction:  Yes.   I discussed the limitations, risks, security and privacy concerns of performing an evaluation and management service by telephone. I also discussed with the patient that there may be a patient responsible charge related to this service. The patient expressed understanding and agreed to proceed.  Patient Location: Home Provider Location: [facility] (Office)  Participants in virtual visit: - Patient: Jennifer Bass - CMA: Randa Lynn, CMA - Provider: Dr Althea Charon  ---------------------------------------------------------------------- Chief Complaint  Patient presents with  . Sore Throat    Bumps on tongue  . Fever  . Generalized Body Aches    S: Reviewed CMA documentation. I have called patient and gathered additional HPI as follows:  Possible COVID Viral Syndrome / Sore Reports that symptoms started 02/19/20 she noticed sore throat and bumps on tongue, next day 02/20/20 she noticed significant change in her symptoms with sore throat and cough. She took OTC medication for cold with improvement, then since yesterday she has had worse time with low grade fever < 101F and with bodyaches. - Tried OTC Alka Seltzer Plus Cold & Flu, including 325 acetaminophen x 2 per dose = 650, every 4 hours.   Patient is currently in isolation Denies any known or suspected exposure to person with or possibly with COVID19.  Admits fever chills bodyaches Denies cough, shortness of breath, sinus pain or pressure, headache, abdominal pain, diarrhea  Past Medical History:  Diagnosis Date  . Family history of breast cancer    qualifies for cancer genetic testing age 34   Social  History   Tobacco Use  . Smoking status: Never Smoker  . Smokeless tobacco: Never Used  Vaping Use  . Vaping Use: Every day  . Substances: Nicotine, Flavoring  Substance Use Topics  . Alcohol use: Yes    Comment: occ  . Drug use: Yes    Types: Marijuana    Current Outpatient Medications:  .  triamcinolone cream (KENALOG) 0.5 %, Apply 1 application topically 2 (two) times daily. To affected areas, for up to 2 weeks., Disp: 30 g, Rfl: 1 .  hydrOXYzine (ATARAX/VISTARIL) 10 MG tablet, Take 1-2 tablets (10-20 mg total) by mouth every 8 (eight) hours as needed for itching. (Patient not taking: Reported on 02/21/2020), Disp: 30 tablet, Rfl: 1 .  LORazepam (ATIVAN) 1 MG tablet, Take 1 tablet (1 mg total) by mouth every 8 (eight) hours as needed for anxiety. (Patient not taking: Reported on 02/21/2020), Disp: 15 tablet, Rfl: 0 .  medroxyPROGESTERone Acetate 150 MG/ML SUSY, Inject 1 mL (150 mg total) into the muscle once for 1 dose., Disp: 1 mL, Rfl: 3 .  predniSONE (DELTASONE) 20 MG tablet, Take 2 tablets (40 mg total) by mouth daily with breakfast. For 3 days (Patient not taking: Reported on 02/21/2020), Disp: 6 tablet, Rfl: 0  Depression screen [facility] 2/9 12/26/2018  Decreased Interest 0  Down, Depressed, Hopeless 0  PHQ - 2 Score 0    GAD 7 : Generalized Anxiety Score 12/26/2018  Nervous, Anxious, on Edge 2  Control/stop worrying 1  Worry too much - different things 1  Trouble relaxing 1  Restless 1  Easily annoyed or irritable 1  Afraid - awful might happen 1  Total GAD 7 Score 8  Anxiety Difficulty Somewhat difficult    -------------------------------------------------------------------------- O: No physical exam performed due to remote telephone encounter.  Lab results reviewed.  No results found for this or any previous visit (from the past 2160 hour(s)).  -------------------------------------------------------------------------- A&P:  Problem List Items Addressed This Visit    None   Visit Diagnoses    Suspected COVID-19 virus infection    -  Primary   Generalized body aches       Fever and chills         Clinically viral syndrome flu-like symptoms, cannot rule out COVID Unvaccinated to covid and flu Symptom onset 02/19/20 Pending COVID test from 02/20/20 - awaiting result.  Reassurance given, mild symptoms currently, low grade temp, improved on OTC medications Continue symptom management OTC Declined further rx for symptoms F/u test result - see below, may offer flu treatment if covid negative Quarantine rules reviewed for covid, leave quarantine after 5 day from positive test if fever free off medication  No orders of the defined types were placed in this encounter.   Follow-up: - Return in 1 week if not improved  CONTACT OFFICE BACK WITH COVID RESULTS - If negative - she can contact us back and we may be able to offer Influenza treatment if she is interested - [place] / Tamiflu  Patient verbalizes understanding with the above medical recommendations including the limitation of remote medical advice.  Specific follow-up and call-back criteria were given for patient to follow-up or seek medical care more urgently if needed.   - Time spent in direct consultation with patient on phone: 9 minutes   Saralyn Pilar, DO [facility] 02/21/2020, 9:46 AM

## 2020-02-27 ENCOUNTER — Ambulatory Visit: Payer: Self-pay | Admitting: *Deleted

## 2020-02-27 ENCOUNTER — Telehealth (INDEPENDENT_AMBULATORY_CARE_PROVIDER_SITE_OTHER): Payer: Medicaid Other | Admitting: Family Medicine

## 2020-02-27 ENCOUNTER — Encounter: Payer: Self-pay | Admitting: Family Medicine

## 2020-02-27 ENCOUNTER — Other Ambulatory Visit: Payer: Self-pay

## 2020-02-27 DIAGNOSIS — U071 COVID-19: Secondary | ICD-10-CM

## 2020-02-27 DIAGNOSIS — J9801 Acute bronchospasm: Secondary | ICD-10-CM

## 2020-02-27 MED ORDER — PREDNISONE 10 MG PO TABS: ORAL_TABLET | ORAL | 0 refills | Status: DC

## 2020-02-27 MED ORDER — ALBUTEROL SULFATE HFA 108 (90 BASE) MCG/ACT IN AERS: 1.0000 | INHALATION_SPRAY | RESPIRATORY_TRACT | 0 refills | Status: DC | PRN

## 2020-02-27 NOTE — Telephone Encounter (Signed)
Patient is calling to report she was diagnosed with COVID 1 week ago- she states she was getting better- her symptoms have improved- but last night she started feeling a tightness in her chest and she reports pain and pressure with deep breathes. Call to office- appointment scheduled with PCP- virtual- patient instructed on virtual visit.  Reason for Disposition . [1] MILD difficulty breathing (e.g., minimal/no SOB at rest, SOB with walking, pulse <100) AND [2] NEW-onset or WORSE than normal  Answer Assessment - Initial Assessment Questions 1. RESPIRATORY STATUS: "Describe your breathing?" (e.g., wheezing, shortness of breath, unable to speak, severe coughing)      Heaviness and chest pain with deep breaths - sometimes with normal breathing 2. ONSET: "When did this breathing problem begin?"      Changes last night and this morning 3. PATTERN "Does the difficult breathing come and go, or has it been constant since it started?"      Constant since started last night- patient states she was doing well with COVID symptoms- actually improving- and then she started having chest pressure 4. SEVERITY: "How bad is your breathing?" (e.g., mild, moderate, severe)    - MILD: No SOB at rest, mild SOB with walking, speaks normally in sentences, can lay down, no retractions, pulse < 100.    - MODERATE: SOB at rest, SOB with minimal exertion and prefers to sit, cannot lie down flat, speaks in phrases, mild retractions, audible wheezing, pulse 100-120.    - SEVERE: Very SOB at rest, speaks in single words, struggling to breathe, sitting hunched forward, retractions, pulse > 120      mild 5. RECURRENT SYMPTOM: "Have you had difficulty breathing before?" If Yes, ask: "When was the last time?" and "What happened that time?"      no 6. CARDIAC HISTORY: "Do you have any history of heart disease?" (e.g., heart attack, angina, bypass surgery, angioplasty)      no 7. LUNG HISTORY: "Do you have any history of lung  disease?"  (e.g., pulmonary embolus, asthma, emphysema)     no 8. CAUSE: "What do you think is causing the breathing problem?"      COVID 9. OTHER SYMPTOMS: "Do you have any other symptoms? (e.g., dizziness, runny nose, cough, chest pain, fever)     Patient reports she was improving with COVID symptoms 10. PREGNANCY: "Is there any chance you are pregnant?" "When was your last menstrual period?"       LMP- last week of December, trying for pregnancy 11. TRAVEL: "Have you traveled out of the country in the last month?" (e.g., travel history, exposures)       COVID- 02/20/20  Protocols used: BREATHING DIFFICULTY-A-AH

## 2020-02-27 NOTE — Progress Notes (Signed)
Virtual Visit via Telephone The purpose of this virtual visit is to provide medical care while limiting exposure to the novel coronavirus (COVID19) for both patient and office staff.  Consent was obtained for phone visit:  Yes.   Answered questions that patient had about telehealth interaction:  Yes.   I discussed the limitations, risks, security and privacy concerns of performing an evaluation and management service by telephone. I also discussed with the patient that there may be a patient responsible charge related to this service. The patient expressed understanding and agreed to proceed.  Patient Location: Home Provider Location: [facility] (Office)  Participants in virtual visit: - Patient: Jennifer Bass - CMA: Burnell Blanks, CMA - Provider: Dr Althea Charon  ---------------------------------------------------------------------- Chief Complaint  Patient presents with  . Covid Positive    S: Reviewed CMA documentation. I have called patient and gathered additional HPI as follows:  COVID-19 infection Chest Pressure vs Sharp Pains Recent virtual visit with me on 02/21/20, see that office note - I recommended covid testing and symptom management  Symptoms started 02/19/20, she tested POSITIVE for COVID on 02/20/20. Symptoms dramatically improved a few days in, she felt almost 100% resolved.  Now today day 7 in, she feels a sharp pain in chest with deep breathing, and some chest pressure all over upper part of chest, non focal. Fairly constant problem. Denies fever, congestion, cough, drainage  Denies any fevers, chills, sweats, body ache, cough, shortness of breath, sinus pain or pressure, headache, abdominal pain, diarrhea  Past Medical History:  Diagnosis Date  . Family history of breast cancer    qualifies for cancer genetic testing age 61   Social History   Tobacco Use  . Smoking status: Never Smoker  . Smokeless tobacco: Never Used  Vaping Use  .  Vaping Use: Every day  . Substances: Nicotine, Flavoring  Substance Use Topics  . Alcohol use: Yes    Comment: occ  . Drug use: Yes    Types: Marijuana    Current Outpatient Medications:  .  predniSONE (DELTASONE) 10 MG tablet, Take 6 tabs with breakfast Day 1, 5 tabs Day 2, 4 tabs Day 3, 3 tabs Day 4, 2 tabs Day 5, 1 tab Day 6., Disp: 21 tablet, Rfl: 0 .  triamcinolone cream (KENALOG) 0.5 %, Apply 1 application topically 2 (two) times daily. To affected areas, for up to 2 weeks., Disp: 30 g, Rfl: 1 .  albuterol (VENTOLIN HFA) 108 (90 Base) MCG/ACT inhaler, Inhale 1-2 puffs into the lungs every 4 (four) hours as needed for wheezing or shortness of breath (cough)., Disp: 1 each, Rfl: 0 .  LORazepam (ATIVAN) 1 MG tablet, Take 1 tablet (1 mg total) by mouth every 8 (eight) hours as needed for anxiety. (Patient not taking: No sig reported), Disp: 15 tablet, Rfl: 0 .  medroxyPROGESTERone Acetate 150 MG/ML SUSY, Inject 1 mL (150 mg total) into the muscle once for 1 dose., Disp: 1 mL, Rfl: 3  Depression screen [facility] 2/9 12/26/2018  Decreased Interest 0  Down, Depressed, Hopeless 0  PHQ - 2 Score 0    GAD 7 : Generalized Anxiety Score 12/26/2018  Nervous, Anxious, on Edge 2  Control/stop worrying 1  Worry too much - different things 1  Trouble relaxing 1  Restless 1  Easily annoyed or irritable 1  Afraid - awful might happen 1  Total GAD 7 Score 8  Anxiety Difficulty Somewhat difficult    -------------------------------------------------------------------------- O: No physical exam  performed due to remote telephone encounter.  Lab results reviewed.  Recent Results (from the past 2160 hour(s))  Novel Coronavirus, NAA ([facility])     Status: Abnormal   Collection Time: 02/20/20  5:41 PM   Specimen: Nasopharyngeal(NP) swabs in vial transport medium   Nasopharynge  Result Value Ref Range   SARS-CoV-2, NAA Detected (A) Not Detected    Comment: Patients who have a positive COVID-19 test  result may now have treatment options. Treatment options are available for patients with mild to moderate symptoms and for hospitalized patients. Visit our website at [email or web address] for resources and information. This nucleic acid amplification test was developed and its performance characteristics determined by [employer]. Nucleic acid amplification tests include RT-PCR and TMA. This test has not been FDA cleared or approved. This test has been authorized by FDA under an Emergency Use Authorization (EUA). This test is only authorized for the duration of time the declaration that circumstances exist justifying the authorization of the emergency use of in vitro diagnostic tests for detection of SARS-CoV-2 virus and/or diagnosis of COVID-19 infection under section 564(b)(1) of the Act, 21 U.S.C. 332RJJ-8(A) (1), unless the authorization is terminated or revoked sooner. When diagnostic testing is negativ e, the possibility of a false negative result should be considered in the context of a patient's recent exposures and the presence of clinical signs and symptoms consistent with COVID-19. An individual without symptoms of COVID-19 and who is not shedding SARS-CoV-2 virus would expect to have a negative (not detected) result in this assay.   SARS-COV-2, NAA 2 DAY TAT     Status: None   Collection Time: 02/20/20  5:41 PM   Nasopharynge  Result Value Ref Range   SARS-CoV-2, NAA 2 DAY TAT Performed     -------------------------------------------------------------------------- A&P:  Problem List Items Addressed This Visit   None   Visit Diagnoses    COVID-19 virus infection    -  Primary   Relevant Medications   predniSONE (DELTASONE) 10 MG tablet   albuterol (VENTOLIN HFA) 108 (90 Base) MCG/ACT inhaler   Other Relevant Orders   DG Chest 2 View   Bronchospasm, acute       Relevant Medications   predniSONE (DELTASONE) 10 MG tablet   albuterol (VENTOLIN  HFA) 108 (90 Base) MCG/ACT inhaler   Other Relevant Orders   DG Chest 2 View     Reassuring she is mostly resolved from COVID No dyspnea, productive cough or fever  Concern with some chest pressure new onset, generalized, and some sharp pains with breathing at times.  Trial Albuterol PRN and Prednisone taper  STAT Chest X-ray ordered for [facility], she will arrive 9am-10am, already notified Toniann Fail in X-ray and she will instruct patient on protocol.  F/u results, can add antibiotic if indicated or she will f/u with [place] if not improving or seek care more immediately if severe worse symptoms, she understands return criteria.  Consider future in person visit if indicated within 1-2 weeks or Respiratory Clinic if needed.  Meds ordered this encounter  Medications  . predniSONE (DELTASONE) 10 MG tablet    Sig: Take 6 tabs with breakfast Day 1, 5 tabs Day 2, 4 tabs Day 3, 3 tabs Day 4, 2 tabs Day 5, 1 tab Day 6.    Dispense:  21 tablet    Refill:  0  . DISCONTD: albuterol (VENTOLIN HFA) 108 (90 Base) MCG/ACT inhaler    Sig: Inhale 1-2 puffs into the lungs every 4 (four) hours as  needed for wheezing or shortness of breath (cough).    Dispense:  1 each    Refill:  0  . albuterol (VENTOLIN HFA) 108 (90 Base) MCG/ACT inhaler    Sig: Inhale 1-2 puffs into the lungs every 4 (four) hours as needed for wheezing or shortness of breath (cough).    Dispense:  1 each    Refill:  0   NOTE regarding billing, this visit was coded as NO CHARGE, because it is duplicate Virtual Visit within 6 days. Last visit 02/21/20 virtual for suspected COVID19, billed virtually. Today I gave patient advice in follow-up and treatment plan, and treated this as a "telephone call" and will not send bill since it is not covered, since it has not been 7 day since last virtual visit  Return for Chest X-ray tomorrow 1/19 confirmed. Follow up results  Future consider [facility] if indicated or sooner urgent/care ED if  need  Patient verbalizes understanding with the above medical recommendations including the limitation of remote medical advice.  Specific follow-up and call-back criteria were given for patient to follow-up or seek medical care more urgently if needed.   - Time spent in direct consultation with patient on phone: 15 minutes   Saralyn Pilar, DO [facility] 02/27/2020, 3:58 PM

## 2020-02-28 ENCOUNTER — Other Ambulatory Visit: Payer: Self-pay | Admitting: Family Medicine

## 2020-02-28 ENCOUNTER — Ambulatory Visit: Payer: Medicaid Other

## 2020-02-28 ENCOUNTER — Telehealth: Payer: Self-pay

## 2020-02-28 ENCOUNTER — Other Ambulatory Visit (INDEPENDENT_AMBULATORY_CARE_PROVIDER_SITE_OTHER): Payer: Medicaid Other | Admitting: Family Medicine

## 2020-02-28 ENCOUNTER — Ambulatory Visit
Admission: RE | Admit: 2020-02-28 | Discharge: 2020-02-28 | Disposition: A | Discharge status: Home / Self Care | Payer: Medicaid Other | Attending: Family Medicine | Admitting: Family Medicine

## 2020-02-28 ENCOUNTER — Ambulatory Visit
Admission: RE | Admit: 2020-02-28 | Discharge: 2020-02-28 | Disposition: A | Discharge status: Home / Self Care | Payer: Medicaid Other | Source: Ambulatory Visit | Attending: Family Medicine | Admitting: Family Medicine

## 2020-02-28 DIAGNOSIS — N912 Amenorrhea, unspecified: Secondary | ICD-10-CM | POA: Diagnosis not present

## 2020-02-28 DIAGNOSIS — R071 Chest pain on breathing: Secondary | ICD-10-CM | POA: Diagnosis not present

## 2020-02-28 DIAGNOSIS — R079 Chest pain, unspecified: Secondary | ICD-10-CM | POA: Diagnosis not present

## 2020-02-28 LAB — POCT URINE PREGNANCY: Preg Test, Ur: NEGATIVE

## 2020-02-28 NOTE — Telephone Encounter (Signed)
Copied from CRM [id number]. Topic: General - Other >> Feb 28, 2020  2:34 PM Jennifer Bass E wrote: Reason for CRM: Pt called to get the Xray and pregnancy results / please advise

## 2020-02-28 NOTE — Telephone Encounter (Signed)
Pt called for xray and pregnancy results

## 2020-02-29 NOTE — Telephone Encounter (Signed)
Patient was already notified yesterday by Dr Kirtland Bouchard.

## 2020-03-12 ENCOUNTER — Ambulatory Visit: Payer: Medicaid Other | Admitting: Unknown Physician Specialty

## 2020-05-07 ENCOUNTER — Ambulatory Visit (INDEPENDENT_AMBULATORY_CARE_PROVIDER_SITE_OTHER): Payer: Medicaid Other | Admitting: Family Medicine

## 2020-05-07 ENCOUNTER — Encounter: Payer: Self-pay | Admitting: Family Medicine

## 2020-05-07 ENCOUNTER — Other Ambulatory Visit: Payer: Self-pay

## 2020-05-07 VITALS — BP 98/33 | HR 85 | Ht 66.0 in | Wt 170.2 lb

## 2020-05-07 DIAGNOSIS — F419 Anxiety disorder, unspecified: Secondary | ICD-10-CM

## 2020-05-07 DIAGNOSIS — M62838 Other muscle spasm: Secondary | ICD-10-CM | POA: Diagnosis not present

## 2020-05-07 DIAGNOSIS — R0789 Other chest pain: Secondary | ICD-10-CM

## 2020-05-07 DIAGNOSIS — R002 Palpitations: Secondary | ICD-10-CM

## 2020-05-07 DIAGNOSIS — R590 Localized enlarged lymph nodes: Secondary | ICD-10-CM | POA: Diagnosis not present

## 2020-05-07 MED ORDER — CYCLOBENZAPRINE HCL 10 MG PO TABS: 5.0000 mg | ORAL_TABLET | Freq: Every evening | ORAL | 0 refills | Status: DC | PRN

## 2020-05-07 NOTE — Progress Notes (Signed)
Subjective:    Patient ID: Jennifer Bass, female    DOB: 01-Aug-2000, 20 y.o.   MRN: [id number]  Jennifer Bass is a 19 y.o. female presenting on 05/07/2020 for Chest Pain   HPI   Atypical Chest Pain  Reports new onset 2 nights ago while at work. She is active walking and doing activities at work but is not straining or significant exertion. Currently today she has not had symptoms of chest pain today at all. She said it would come up sporadically and last for a few hours and then will go away. Seems to be localized to upper chest left side, across into L armpit. Feels more like an aching pain. Not very sharp stabbing pain except if moves arm or chest in a position.  Her work shift is 2pm to 11pm, typically worsening pain toward the evening of that shift.  - She tried Tylenol PRN and went to bed, it did not seem to help much. Rest / sleep does help it. Not taking Ibuprofen or NSAID. She had previous inhaler albuterol but did not use because not endorsing shortness of breath.  Prior history of COVID19 in January 2022, she was treated with steroid prednisone, albuterol, and had chest x-ray, she was experiencing generalized chest pains both sides of chest.  If she rubs across the area on her chest it can feel worse but then improve. Describes more of a "tightness" in chest. She says if takes a deep breath it does not worsen her chest pain. She does not feel winded.  Does not seem to be related to acid reflux or heartburn or meals.  She has history of anxiety and stress in past, does not think that this contributing at this time.  History of heart murmur since childhood, was told the problem resolved and no more murmur. He had cardiac evaluations as child and stopped around age 103-11.  She has reduced vaping, and not often doing that at work. Drinks cup of coffee in AM    Left Lymphadenopathy, isolated.  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 She said now it is improved but still present, never goes away past 1 year. 1st cousin had a lymph node problem. She had to get surgery on it. Asks for imaging or evaluation   Depression screen [facility] 2/9 05/07/2020 12/26/2018  Decreased Interest 0 0  Down, Depressed, Hopeless 0 0  PHQ - 2 Score 0 0   GAD 7 : Generalized Anxiety Score 05/07/2020 12/26/2018  Nervous, Anxious, on Edge 0 2  Control/stop worrying 0 1  Worry too much - different things 0 1  Trouble relaxing 0 1  Restless 0 1  Easily annoyed or irritable 0 1  Afraid - awful might happen 0 1  Total GAD 7 Score 0 8  Anxiety Difficulty Not difficult at all Somewhat difficult      Social History   Tobacco Use  . Smoking status: Never Smoker  . Smokeless tobacco: Never Used  Vaping Use  . Vaping Use: Every day  . Substances: Nicotine, Flavoring  Substance Use Topics  . Alcohol use: Yes    Comment: occasionally   . Drug use: Yes    Types: Marijuana    Review of Systems Per HPI unless specifically indicated above     Objective:    BP (!) 98/33   Pulse 85  Ht 5\' 6"  (1.676 m)   Wt 170 lb 3.2 oz (77.2 kg)   SpO2 100%   BMI 27.47 kg/m   Wt Readings from Last 3 Encounters:  05/07/20 170 lb 3.2 oz (77.2 kg)  02/21/20 160 lb (72.6 kg)  02/11/20 175 lb (79.4 kg)    Physical Exam Vitals and nursing note reviewed.  Constitutional:      General: She is not in acute distress.    Appearance: She is well-developed. She is not diaphoretic.     Comments: Well-appearing, comfortable, cooperative  HENT:     Head: Normocephalic and atraumatic.  Eyes:     General:        Right eye: No discharge.        Left eye: No discharge.     Conjunctiva/sclera: Conjunctivae normal.  Neck:     Thyroid: No thyromegaly.  Cardiovascular:     Rate and Rhythm: Normal rate and regular rhythm.     Heart sounds: Normal heart sounds. No murmur  heard.   Pulmonary:     Effort: Pulmonary effort is normal. No respiratory distress.     Breath sounds: Normal breath sounds. No wheezing or rales.  Chest:     Chest wall: No tenderness (today not reproduced on exam).  Breasts:     Right: No axillary adenopathy or supraclavicular adenopathy.     Left: No axillary adenopathy or supraclavicular adenopathy.    Musculoskeletal:        General: Normal range of motion.     Cervical back: Normal range of motion and neck supple.  Lymphadenopathy:     Cervical: Cervical adenopathy present.     Right cervical: No superficial, deep or posterior cervical adenopathy.    Left cervical: Posterior cervical adenopathy (localized isolated 1 cm approx lymph node enlarged non tender) present. No superficial or deep cervical adenopathy.     Upper Body:     Right upper body: No supraclavicular or axillary adenopathy.     Left upper body: No supraclavicular or axillary adenopathy.  Skin:    General: Skin is warm and dry.     Findings: No erythema or rash.  Neurological:     Mental Status: She is alert and oriented to person, place, and time.  Psychiatric:        Behavior: Behavior normal.     Comments: Well groomed, good eye contact, normal speech and thoughts      I have personally reviewed the radiology report from 02/28/20 CXR.  CLINICAL DATA:  Chest pain  EXAM: CHEST  1 VIEW  COMPARISON:  October 18, 2015  FINDINGS: Lungs are clear. The heart size and pulmonary vascularity normal. No adenopathy. No pneumothorax. There is mild upper thoracic levoscoliosis.  IMPRESSION: Lungs clear.  Cardiac silhouette within normal limits.   Electronically Signed   By: October 20, 2015 III M.D.   On: 02/28/2020 15:46  Results for orders placed or performed in visit on 02/28/20  POCT urine pregnancy  Result Value Ref Range   Preg Test, Ur Negative Negative      Assessment & Plan:   Problem List Items Addressed This Visit   None    Visit Diagnoses    Atypical chest pain    -  Primary   Relevant Medications   cyclobenzaprine (FLEXERIL) 10 MG tablet   Other Relevant Orders   Ambulatory referral to Cardiology   Intermittent palpitations       Relevant Orders   Ambulatory referral to Cardiology  Left cervical lymphadenopathy       Relevant Orders   US Soft Tissue Head/Neck (NON-THYROID)   Anxiety       Muscle spasm       Relevant Medications   cyclobenzaprine (FLEXERIL) 10 MG tablet      Atypical Chest Pain Palpitations  refer to cardiology for left sided upper chest wall atypical pain and pressure, fairly predictable onset with evenings worse, not endorsing any GERD, does have anxiety but has been much improved and now chest symptoms are worse, does have some reproducibility physically on exam. Prior EKGs normal, she has history of heart murmur as a child and cardiac work up until age 18-11 that was negative. Recommending heart monitor for duration to assess episodic atypical symptoms and associated palpitations.   Trial on Flexeril 5-10mg  QHS PRN as possible symptom relief if MSK related - which is most likely answer in combination with anxiety.  We agreed to defer anxiety treatment at this time until further testing and evaluation are completed and counseling that she has prior diagnosis but even though anxiety is now improved, still could be physical manifestation of that - we can review anxiety again after further treatment and if still the problem.  Less likely GERD, defer medication now, we discussed it could be silent reflux too with PM pattern, may reconsider if needed.  Can take NSAID PRN if prefer as well or topical Voltaren if more of a costochondritis localized inflammatory condition.  #Lymphadenopathy Isolated L posterior cervical >1 year Chronic issue, will pursue ultrasound at this time to follow-up on it diagnostic eval, and proceed from there, if normal can do surveillance on it.  #GYN -  advised her to return to [facility] for routine screening pap, wellness, chlamydia screening and other routine testing.   Orders Placed This Encounter  Procedures  . US Soft Tissue Head/Neck (NON-THYROID)    Standing Status:   Future    Standing Expiration Date:   11/07/2020    Order Specific Question:   Reason for Exam (SYMPTOM  OR DIAGNOSIS REQUIRED)    Answer:   isolated left posterior cervical lymphadenopathy single node, non tender, >1 year    Order Specific Question:   Preferred imaging location?    Answer:   [facility]  . Ambulatory referral to Cardiology    Referral Priority:   Routine    Referral Type:   Consultation    Referral Reason:   Specialty Services Required    Requested Specialty:   Cardiology    Number of Visits Requested:   1     Meds ordered this encounter  Medications  . cyclobenzaprine (FLEXERIL) 10 MG tablet    Sig: Take 0.5-1 tablets (5-10 mg total) by mouth at bedtime as needed for muscle spasms.    Dispense:  30 tablet    Refill:  0      Follow up plan: Return in about 6 weeks (around 06/18/2020) for 4-6 weeks atypical chest pain / anxiety?Marland Kitchen   Saralyn Pilar, DO [facility] 05/07/2020, 4:00 PM

## 2020-05-07 NOTE — Patient Instructions (Addendum)
Thank you for coming to the office today.  Stay tuned for referral / new apt with Cardiology specialist  [facility] at [facility] [address] [place], [place] [address] Main: [phone]   They can discuss the chest symptoms and possible palpitations and do heart monitor testing.  ----  Try to see if muscle relaxant gives temporary or longer lasting relief  Start Cyclobenzapine (Flexeril) 10mg  tablets (muscle relaxant) - start with half (cut) to one whole pill at night for muscle relaxant - may make you sedated or sleepy (be careful driving or working on this) if tolerated you can take half to whole tab 2 to 3 times daily or every 8 hours as needed - but prefer NIGHT TIME ONLY.  If this does not work, may consider Ibuprofen 200-600mg  per dose in evening with food/snack to help ease the symptoms, but prefer to avoid this if you can. However if it ends up being a more inflammatory condition of the chest wall / muscle / ribs this may be needed in future.  May try topical Voltaren anti inflammatory cream if needed if you prefer.  ----  Stay tuned for call / schedule apt for ultrasound of neck for the lymph node.  [facility] Address: [address], [place], [place] Phone: [phone]  If it is a problem may need another scan or referral, otherwise if it appears normal, may need to repeat ultrasound yearly for now.  ---  Reminder to check in with   [facility] for routine prevention appointment.   Please schedule a Follow-up Appointment to: Return in about 6 weeks (around 06/18/2020) for 4-6 weeks atypical chest pain / anxiety?.  If you have any other questions or concerns, please feel free to call the office or send a message through MyChart. You may also schedule an earlier appointment if necessary.  Additionally, you may be receiving a survey about your experience at our  office within a few days to 1 week by e-mail or mail. We value your feedback.  08/18/2020, DO [facility], [facility]

## 2020-05-14 ENCOUNTER — Ambulatory Visit (INDEPENDENT_AMBULATORY_CARE_PROVIDER_SITE_OTHER): Payer: Medicaid Other | Admitting: Obstetrics

## 2020-05-14 ENCOUNTER — Other Ambulatory Visit (HOSPITAL_COMMUNITY)
Admission: RE | Admit: 2020-05-14 | Discharge: 2020-05-14 | Disposition: A | Discharge status: Home / Self Care | Payer: Medicaid Other | Source: Ambulatory Visit | Attending: Obstetrics | Admitting: Obstetrics

## 2020-05-14 ENCOUNTER — Encounter: Payer: Self-pay | Admitting: Obstetrics

## 2020-05-14 ENCOUNTER — Other Ambulatory Visit: Payer: Self-pay

## 2020-05-14 VITALS — BP 122/70 | Ht 66.0 in | Wt 175.0 lb

## 2020-05-14 DIAGNOSIS — Z124 Encounter for screening for malignant neoplasm of cervix: Secondary | ICD-10-CM | POA: Diagnosis not present

## 2020-05-14 DIAGNOSIS — Z0000 Encounter for general adult medical examination without abnormal findings: Secondary | ICD-10-CM | POA: Diagnosis not present

## 2020-05-14 DIAGNOSIS — Z01419 Encounter for gynecological examination (general) (routine) without abnormal findings: Secondary | ICD-10-CM | POA: Diagnosis not present

## 2020-05-14 NOTE — Progress Notes (Signed)
Gynecology Annual Exam  PCP: Smitty Cords, DO  Chief Complaint:  Chief Complaint  Patient presents with  . Annual Exam    History of Present Illness: Patient is a 20 y.o. G1P1001 presents for annual exam. The patient has no complaints today. She is not contracepting, and would like to be pregnant.she shares that they have not been using [facility] for several years and yet she has not conceived. She works at a [employer], and has a 22 year old at home. She lives with her BF and his mother.  LMP: Patient's last menstrual period was 04/30/2020. Menarche:9 Average Interval: regular, 28 days Duration of flow: 7 days Heavy Menses: no Clots: no Intermenstrual Bleeding: no Postcoital Bleeding: no Dysmenorrhea: no  The patient is sexually active. She currently uses none for contraception. She denies dyspareunia.  The patient does perform self breast exams.  There is notable family history of breast or ovarian cancer in her family.She has two great aunts that had breast cancer. She is uncertain regarding their ages when diagnosed, but believes that one was in her thirties.  The patient wears seatbelts: yes.  The patient has regular exercise: yes.    The patient denies current symptoms of depression.  She has a hx of anxiety, and has a script for Lorazepam that she uses rarely.  Review of Systems: Review of Systems  Constitutional: Negative.   HENT: Negative.   Eyes: Negative.   Respiratory: Negative.   Cardiovascular: Negative.   Gastrointestinal: Negative.   Genitourinary: Negative.   Musculoskeletal: Negative.   Skin: Negative.   Neurological: Negative.   Endo/Heme/Allergies: Negative.   Psychiatric/Behavioral: Negative.     Past Medical History:  There are no problems to display for this patient.   Past Surgical History:  Past Surgical History:  Procedure Laterality Date  . CESAREAN SECTION  09/2016  . WISDOM TOOTH EXTRACTION      Gynecologic  History:  Patient's last menstrual period was 04/30/2020. Contraception: none Last Pap: Results were: NA    Obstetric History: G1P1001  Family History:  Family History  Problem Relation Age of Onset  . Breast cancer Maternal Aunt        30s  . Breast cancer Other        2 Aunts, both deceased  . Breast cancer Maternal Great-grandfather     Social History:  Social History   Socioeconomic History  . Marital status: Single    Spouse name: Not on file  . Number of children: Not on file  . Years of education: Not on file  . Highest education level: Not on file  Occupational History  . Not on file  Tobacco Use  . Smoking status: Never Smoker  . Smokeless tobacco: Never Used  Vaping Use  . Vaping Use: Every day  . Substances: Nicotine, Flavoring  Substance and Sexual Activity  . Alcohol use: Yes    Comment: occasionally   . Drug use: Not Currently    Types: Marijuana  . Sexual activity: Yes  Other Topics Concern  . Not on file  Social History Narrative  . Not on file   Social Determinants of Health   Financial Resource Strain: Not on file  Food Insecurity: Not on file  Transportation Needs: Not on file  Physical Activity: Not on file  Stress: Not on file  Social Connections: Not on file  Intimate Partner Violence: Not on file    Allergies:  Allergies  Allergen Reactions  .  Hydrocodone Rash    Medications: Prior to Admission medications   Medication Sig Start Date End Date Taking? Authorizing Provider  LORazepam (ATIVAN) 1 MG tablet Take 1 tablet (1 mg total) by mouth every 8 (eight) hours as needed for anxiety. 02/12/20  Yes Irean Hong, MD  cyclobenzaprine (FLEXERIL) 10 MG tablet Take 0.5-1 tablets (5-10 mg total) by mouth at bedtime as needed for muscle spasms. Patient not taking: Reported on 05/14/2020 05/07/20   Smitty Cords, DO  medroxyPROGESTERone Acetate 150 MG/ML SUSY Inject 1 mL (150 mg total) into the muscle once for 1 dose. 01/12/19  01/12/19  Copland, Ilona Sorrel, PA-C  triamcinolone cream (KENALOG) 0.5 % Apply 1 application topically 2 (two) times daily. To affected areas, for up to 2 weeks. Patient not taking: Reported on 05/14/2020 05/31/19   Smitty Cords, DO  etonogestrel (NEXPLANON) 68 MG IMPL implant 1 each by Subdermal route once.  02/25/19  [provider]    Physical Exam Vitals: Blood pressure 122/70, height 5\' 6"  (1.676 m), weight 175 lb (79.4 kg), last menstrual period 04/30/2020.  General: NAD HEENT: normocephalic, anicteric Thyroid: no enlargement, no palpable nodules Pulmonary: No increased work of breathing, CTAB Cardiovascular: RRR, distal pulses 2+ Breast: Breast symmetrical, no tenderness, no palpable nodules or masses, no skin or nipple retraction present, no nipple discharge.  No axillary or supraclavicular lymphadenopathy. Abdomen: NABS, soft, non-tender, non-distended.  Umbilicus without lesions.  No hepatomegaly, splenomegaly or masses palpable. No evidence of hernia  Genitourinary:  External: Normal external female genitalia.  Normal urethral meatus, normal Bartholin's and Skene's glands.    Vagina: Normal vaginal mucosa, no evidence of prolapse.    Cervix: Grossly normal in appearance, no bleeding  Uterus: Non-enlarged, mobile, normal contour.  No CMT  Adnexa: ovaries non-enlarged, no adnexal masses  Rectal: deferred  Lymphatic: no evidence of inguinal lymphadenopathy Extremities: no edema, erythema, or tenderness Neurologic: Grossly intact Psychiatric: mood appropriate, affect full  Female chaperone present for pelvic and breast  portions of the physical exam  GAD score: 6 PHQ score- 4.  Assessment: 20 y.o. G1P1001 routine annual exam Mild anxiety\tobacco use - vaping  For first pap smear. STI screening off of the pap. Plan: Problem List Items Addressed This Visit   None   Visit Diagnoses    Women's annual routine gynecological examination    -  Primary    Cervical cancer screening       Relevant Orders   Cytology - PAP      1) 4) Gardasil Series discussed and if applicable offered to patient - Patient has previously completed 3 shot series   2) STI screening  wasoffered and accepted  Today for GC/CZ. 3)  ASCCP guidelines and rational discussed.  Patient opts for yearly screening interval  4) Contraception - the patient is currently using  none.  She is attempting to conceive in the near future We discussed safe sex practices to reduce her furture risk of STI's.    5) Return in about 1 year (around 05/14/2021) for annual.  Pre conceptual counseling provided today. She is strongly encouraged to address her anxiety with her PCP, and to stop the vaping.Offered the option of an office visit with this provider to discuss her vagping and anxiety, and treatment for both. To start on a daily multivitamin.   07/14/2021, CNM  05/14/2020 1:10 PM   [facility], [facility] 05/14/2020, 1:10 PM

## 2020-05-15 ENCOUNTER — Ambulatory Visit
Admission: RE | Admit: 2020-05-15 | Discharge: 2020-05-15 | Disposition: A | Discharge status: Home / Self Care | Payer: Medicaid Other | Source: Ambulatory Visit | Attending: Family Medicine | Admitting: Family Medicine

## 2020-05-15 ENCOUNTER — Other Ambulatory Visit: Payer: Self-pay

## 2020-05-15 DIAGNOSIS — R590 Localized enlarged lymph nodes: Secondary | ICD-10-CM | POA: Diagnosis not present

## 2020-05-16 LAB — CYTOLOGY - PAP
Chlamydia: NEGATIVE
Comment: NEGATIVE
Comment: NEGATIVE
Comment: NORMAL
Diagnosis: NEGATIVE
Neisseria Gonorrhea: NEGATIVE
Trichomonas: NEGATIVE

## 2020-05-21 ENCOUNTER — Encounter: Payer: Self-pay | Admitting: Obstetrics

## 2020-05-23 ENCOUNTER — Ambulatory Visit: Payer: Medicaid Other | Admitting: Cardiology

## 2020-05-24 ENCOUNTER — Encounter: Payer: Self-pay | Admitting: Cardiology

## 2020-05-27 ENCOUNTER — Encounter: Payer: Self-pay | Admitting: Obstetrics

## 2020-09-26 DIAGNOSIS — Z20822 Contact with and (suspected) exposure to covid-19: Secondary | ICD-10-CM | POA: Diagnosis not present

## 2020-10-28 ENCOUNTER — Telehealth: Payer: Medicaid Other | Admitting: Physician Assistant

## 2020-10-28 DIAGNOSIS — J029 Acute pharyngitis, unspecified: Secondary | ICD-10-CM | POA: Diagnosis not present

## 2020-10-28 NOTE — Progress Notes (Signed)
  E-Visit for Sore Throat  We are sorry that you are not feeling well.  Here is how we plan to help!  Your symptoms indicate a likely viral infection (Pharyngitis).   Pharyngitis is inflammation in the back of the throat which can cause a sore throat, scratchiness and sometimes difficulty swallowing.   Pharyngitis is typically caused by a respiratory virus and will just run its course.  Please keep in mind that your symptoms could last up to 10 days.  For throat pain, we recommend over the counter oral pain relief medications such as acetaminophen or aspirin, or anti-inflammatory medications such as ibuprofen or naproxen sodium.  Topical treatments such as oral throat lozenges or sprays may be used as needed.  Avoid close contact with loved ones, especially the very young and elderly.  Remember to wash your hands thoroughly throughout the day as this is the number one way to prevent the spread of infection and wipe down door knobs and counters with disinfectant.  After careful review of your answers, I would not recommend and antibiotic for your condition.  Antibiotics should not be used to treat conditions that we suspect are caused by viruses like the virus that causes the common cold or flu. However, some people can have Strep with atypical symptoms. You may need formal testing in clinic or office to confirm if your symptoms continue or worsen.  Providers prescribe antibiotics to treat infections caused by bacteria. Antibiotics are very powerful in treating bacterial infections when they are used properly.  To maintain their effectiveness, they should be used only when necessary.  Overuse of antibiotics has resulted in the development of super bugs that are resistant to treatment!    Home Care: Only take medications as instructed by your medical team. Do not drink alcohol while taking these medications. A steam or ultrasonic humidifier can help congestion.  You can place a towel over your head and  breathe in the steam from hot water coming from a faucet. Avoid close contacts especially the very young and the elderly. Cover your mouth when you cough or sneeze. Always remember to wash your hands.  Get Help Right Away If: You develop worsening fever or throat pain. You develop a severe head ache or visual changes. Your symptoms persist after you have completed your treatment plan.  Make sure you Understand these instructions. Will watch your condition. Will get help right away if you are not doing well or get worse.   Thank you for choosing an e-visit.  Your e-visit answers were reviewed by a board certified advanced clinical practitioner to complete your personal care plan. Depending upon the condition, your plan could have included both over the counter or prescription medications.  Please review your pharmacy choice. Make sure the pharmacy is open so you can pick up prescription now. If there is a problem, you may contact your provider through [employer] and have the prescription routed to another pharmacy.  Your safety is important to [place]. If you have drug allergies check your prescription carefully.   For the next 24 hours you can use MyChart to ask questions about today's visit, request a non-urgent call back, or ask for a work or school excuse. You will get an email in the next two days asking about your experience. I hope that your e-visit has been valuable and will speed your recovery.   I provided 6 minutes of non face-to-face time during this encounter for chart review and documentation.

## 2020-10-30 ENCOUNTER — Telehealth: Payer: Medicaid Other | Admitting: Nurse Practitioner

## 2020-10-30 DIAGNOSIS — J069 Acute upper respiratory infection, unspecified: Secondary | ICD-10-CM

## 2020-10-30 DIAGNOSIS — J00 Acute nasopharyngitis [common cold]: Secondary | ICD-10-CM

## 2020-10-30 MED ORDER — BENZONATATE 100 MG PO CAPS: 100.0000 mg | ORAL_CAPSULE | Freq: Three times a day (TID) | ORAL | 0 refills | Status: DC | PRN

## 2020-10-30 MED ORDER — FLUTICASONE PROPIONATE 50 MCG/ACT NA SUSP: 2.0000 | Freq: Every day | NASAL | 6 refills | Status: DC

## 2020-10-30 NOTE — Progress Notes (Signed)

## 2020-11-04 ENCOUNTER — Ambulatory Visit: Payer: Medicaid Other | Admitting: Family Medicine

## 2021-01-13 ENCOUNTER — Other Ambulatory Visit: Payer: Self-pay

## 2021-01-13 ENCOUNTER — Encounter: Payer: Self-pay | Admitting: Family Medicine

## 2021-01-13 ENCOUNTER — Ambulatory Visit: Payer: Medicaid Other | Admitting: Family Medicine

## 2021-01-13 VITALS — BP 119/70 | HR 64 | Ht 66.0 in | Wt 229.6 lb

## 2021-01-13 DIAGNOSIS — E669 Obesity, unspecified: Secondary | ICD-10-CM | POA: Diagnosis not present

## 2021-01-13 DIAGNOSIS — L209 Atopic dermatitis, unspecified: Secondary | ICD-10-CM | POA: Diagnosis not present

## 2021-01-13 DIAGNOSIS — F419 Anxiety disorder, unspecified: Secondary | ICD-10-CM | POA: Diagnosis not present

## 2021-01-13 DIAGNOSIS — R21 Rash and other nonspecific skin eruption: Secondary | ICD-10-CM | POA: Diagnosis not present

## 2021-01-13 LAB — POCT GLYCOSYLATED HEMOGLOBIN (HGB A1C): Hemoglobin A1C: 5.1 % (ref 4.0–5.6)

## 2021-01-13 MED ORDER — CLOBETASOL PROPIONATE 0.05 % EX CREA: 1.0000 "application " | TOPICAL_CREAM | Freq: Two times a day (BID) | CUTANEOUS | 0 refills | Status: DC

## 2021-01-13 MED ORDER — PREDNISONE 10 MG PO TABS: ORAL_TABLET | ORAL | 0 refills | Status: DC

## 2021-01-13 MED ORDER — BUPROPION HCL ER (XL) 150 MG PO TB24
150.0000 mg | ORAL_TABLET | Freq: Every day | ORAL | 2 refills | Status: DC
Start: 2021-01-13 — End: 2021-08-11

## 2021-01-13 NOTE — Progress Notes (Signed)
Subjective:    Patient ID: Jennifer Bass, female    DOB: [birth year], 20 y.o.   MRN: [id number]  Jennifer Bass is a 20 y.o. female presenting on 01/13/2021 for Rash   HPI  Rash, non specific Worse first thing in AM having increased itching, worse during day with having clothes rub on it. Not using any new hair product. Red itchy rash on left shoulder and back upper neck area larger patchy areas in contact with hair, no where else. Has itchy rash area she scratched, no pustule or drainage. Not painful. Denies fever  Obesity BMI >37 Wt up from 170 to 229 lbs She has always had fluctuating weight since childhood. Seems when stressed Admits anxiety and mood issue see scores Not on medication interested in options Failed diet exercise lifestyle  Depression screen [facility] 2/9 01/13/2021 05/07/2020 12/26/2018  Decreased Interest 0 0 0  Down, Depressed, Hopeless 0 0 0  PHQ - 2 Score 0 0 0  Altered sleeping 1 - -  Tired, decreased energy 1 - -  Change in appetite 1 - -  Feeling bad or failure about yourself  0 - -  Trouble concentrating 0 - -  Moving slowly or fidgety/restless 0 - -  Suicidal thoughts 0 - -  PHQ-9 Score 3 - -  Difficult doing work/chores Not difficult at all - -   GAD 7 : Generalized Anxiety Score 01/13/2021 05/07/2020 12/26/2018  Nervous, Anxious, on Edge 2 0 2  Control/stop worrying 1 0 1  Worry too much - different things 1 0 1  Trouble relaxing 1 0 1  Restless 1 0 1  Easily annoyed or irritable 1 0 1  Afraid - awful might happen 1 0 1  Total GAD 7 Score 8 0 8  Anxiety Difficulty Not difficult at all Not difficult at all Somewhat difficult     Social History   Tobacco Use   Smoking status: Never   Smokeless tobacco: Never  Vaping Use   Vaping Use: Every day   Substances: Nicotine, Flavoring  Substance Use Topics   Alcohol use: Yes    Comment: occasionally    Drug use: Not Currently    Types: Marijuana    Review of Systems Per HPI unless  specifically indicated above     Objective:    BP 119/70   Pulse 64   Ht 5\' 6"  (1.676 m)   Wt 229 lb 9.6 oz (104.1 kg)   SpO2 98%   BMI 37.06 kg/m   Wt Readings from Last 3 Encounters:  01/13/21 229 lb 9.6 oz (104.1 kg)  05/14/20 175 lb (79.4 kg)  05/07/20 170 lb 3.2 oz (77.2 kg)    Physical Exam Vitals and nursing note reviewed.  Constitutional:      General: She is not in acute distress.    Appearance: Normal appearance. She is well-developed. She is not diaphoretic.     Comments: Well-appearing, comfortable, cooperative  HENT:     Head: Normocephalic and atraumatic.  Eyes:     General:        Right eye: No discharge.        Left eye: No discharge.     Conjunctiva/sclera: Conjunctivae normal.  Cardiovascular:     Rate and Rhythm: Normal rate.  Pulmonary:     Effort: Pulmonary effort is normal.  Skin:    General: Skin is warm and dry.     Findings: Rash (left shoulder upper area and onto neck/upper back)  present. No erythema.  Neurological:     Mental Status: She is alert and oriented to person, place, and time.  Psychiatric:        Mood and Affect: Mood normal.        Behavior: Behavior normal.        Thought Content: Thought content normal.     Comments: Well groomed, good eye contact, normal speech and thoughts   Results for orders placed or performed in visit on 05/14/20  Cytology - PAP  Result Value Ref Range   Neisseria Gonorrhea Negative    Chlamydia Negative    Trichomonas Negative    Adequacy      Satisfactory for evaluation; transformation zone component PRESENT.   Diagnosis      - Negative for intraepithelial lesion or malignancy (NILM)   Microorganisms Shift in flora suggestive of bacterial vaginosis    Comment Normal Reference Range Trichomonas - Negative    Comment Normal Reference Ranger Chlamydia - Negative    Comment      Normal Reference Range Neisseria Gonorrhea - Negative      Assessment & Plan:   Problem List Items Addressed This  Visit   None Visit Diagnoses     Anxiety    -  Primary   Relevant Medications   buPROPion (WELLBUTRIN XL) 150 MG 24 hr tablet   Rash and nonspecific skin eruption       Relevant Medications   clobetasol cream (TEMOVATE) 0.05 %   predniSONE (DELTASONE) 10 MG tablet   Atopic dermatitis, unspecified type       Relevant Medications   clobetasol cream (TEMOVATE) 0.05 %   predniSONE (DELTASONE) 10 MG tablet   Obesity (BMI 35.0-39.9 without comorbidity)       Relevant Orders   POCT glycosylated hemoglobin (Hb A1C)       Pruritic Rash Suspected allergic vs atopic dermatitis distribution seems contact derm neck and shoulder area of contact with hair, suspected etiology recent hair dye She had some spots on face that resolved w/ Triamcinoclone Will trial Clobetasol for rash on back Back up plan if not resolving, can trial short course of Prednisone 3-6 days taper if need, caution with elevation of blood sugar.  Obesity BMI >37 Anxiety A1c today 5.1% normal range. Not PreDm Discussed weight management, has fluctuating wt dramatically with mood often can be related, worse anxiety has lower wt Also has some mood issue but not depression  Medicaid PDL is limited for weight loss.  Will rx Buproprion XL 150mg  daily for mood/anxiety and can do some appetite suppression. Consider Contrave in future if needed, would be higher cost not covered. Considered GLP1 options, again not indicated as not having preDm or DM.  Handout to [facility] as well for more info.    Meds ordered this encounter  Medications   clobetasol cream (TEMOVATE) 0.05 %    Sig: Apply 1 application topically 2 (two) times daily.    Dispense:  30 g    Refill:  0   predniSONE (DELTASONE) 10 MG tablet    Sig: Take 6 tabs with breakfast Day 1, 5 tabs Day 2, 4 tabs Day 3, 3 tabs Day 4, 2 tabs Day 5, 1 tab Day 6.    Dispense:  21 tablet    Refill:  0   buPROPion (WELLBUTRIN XL) 150 MG 24 hr tablet    Sig:  Take 1 tablet (150 mg total) by mouth daily.    Dispense:  30  tablet    Refill:  2      Follow up plan: Return in about 3 months (around 04/13/2021) for 3 month follow-up Mood/ANxiety / Wt Med, rash.   Saralyn Pilar, DO [facility] 01/13/2021, 2:32 PM

## 2021-01-13 NOTE — Patient Instructions (Addendum)
Thank you for coming to the office today.  Try the Clobetasol cream first - twice a day for 1-2 weeks.  If still no results, can use the oral steroid - maybe a 3 day taper first, 3 pills then 2 then 1 can extend by a few more days if needed.  Wellbutrin XL 150mg  daily for mood/anxiety and appetite suppression wt loss  If the rash does not resolve, let me know 2 weeks we can try a rx acne medication topical (would be more anti bacterial cleansing)  If that doesn't work we can refer to Dermatology.  Other wt management options as well.  Stay tuned on A1c fingerstick, if >5.7% then it is Pre Diabetes and we can use medicaid to cover the injectable medicine.   Dr  [facility] [address], [place] Ph: [phone]   Please schedule a Follow-up Appointment to: Return in about 3 months (around 04/13/2021) for 3 month follow-up Mood/ANxiety / Wt Med, rash.  If you have any other questions or concerns, please feel free to call the office or send a message through MyChart. You may also schedule an earlier appointment if necessary.  Additionally, you may be receiving a survey about your experience at our office within a few days to 1 week by e-mail or mail. We value your feedback.  06/13/2021, DO [facility], [facility]

## 2021-01-24 ENCOUNTER — Ambulatory Visit: Payer: Medicaid Other | Admitting: Family Medicine

## 2021-03-07 DIAGNOSIS — H5213 Myopia, bilateral: Secondary | ICD-10-CM | POA: Diagnosis not present

## 2021-03-31 ENCOUNTER — Other Ambulatory Visit: Payer: Self-pay

## 2021-03-31 ENCOUNTER — Encounter: Payer: Self-pay | Admitting: Internal Medicine

## 2021-03-31 ENCOUNTER — Ambulatory Visit: Payer: Medicaid Other | Admitting: Internal Medicine

## 2021-03-31 VITALS — BP 112/63 | HR 80 | Temp 97.3°F | Wt 239.0 lb

## 2021-03-31 DIAGNOSIS — H9202 Otalgia, left ear: Secondary | ICD-10-CM | POA: Diagnosis not present

## 2021-03-31 NOTE — Progress Notes (Signed)
Subjective:    Patient ID: Jennifer Bass, female    DOB: 2000/03/17, 21 y.o.   MRN: [id number]  HPI  Patient presents to clinic today with complaint of left ear pain.  This started 3 days ago.  She describes the pain as a constant ache with intermittent sharp pains.  She denies drainage or loss of hearing.  She denies headache, runny nose, nasal congestion, sore throat or cough.  She denies fever, chills or body aches.  She has tried Tylenol OTC with some relief of symptoms.  She admits that [place] did [place] the [place] this [place] weekend and not sure if change in the elevation is causing some of her symptoms.  Review of Systems     Past Medical History:  Diagnosis Date   Family history of breast cancer    qualifies for cancer genetic testing age 35    Current Outpatient Medications  Medication Sig Dispense Refill   benzonatate (TESSALON PERLES) 100 MG capsule Take 1 capsule (100 mg total) by mouth 3 (three) times daily as needed. 20 capsule 0   buPROPion (WELLBUTRIN XL) 150 MG 24 hr tablet Take 1 tablet (150 mg total) by mouth daily. 30 tablet 2   clobetasol cream (TEMOVATE) 0.05 % Apply 1 application topically 2 (two) times daily. 30 g 0   cyclobenzaprine (FLEXERIL) 10 MG tablet Take 0.5-1 tablets (5-10 mg total) by mouth at bedtime as needed for muscle spasms. (Patient not taking: Reported on 05/14/2020) 30 tablet 0   fluticasone (FLONASE) 50 MCG/ACT nasal spray Place 2 sprays into both nostrils daily. 16 g 6   LORazepam (ATIVAN) 1 MG tablet Take 1 tablet (1 mg total) by mouth every 8 (eight) hours as needed for anxiety. 15 tablet 0   medroxyPROGESTERone Acetate 150 MG/ML SUSY Inject 1 mL (150 mg total) into the muscle once for 1 dose. 1 mL 3   predniSONE (DELTASONE) 10 MG tablet Take 6 tabs with breakfast Day 1, 5 tabs Day 2, 4 tabs Day 3, 3 tabs Day 4, 2 tabs Day 5, 1 tab Day 6. 21 tablet 0   No current facility-administered medications for this visit.    Allergies  Allergen  Reactions   Hydrocodone Rash    Family History  Problem Relation Age of Onset   Breast cancer Maternal Aunt        30s   Breast cancer Other        2 Aunts, both deceased   Breast cancer Maternal Great-grandfather     Social History   Socioeconomic History   Marital status: Single    Spouse name: Not on file   Number of children: Not on file   Years of education: Not on file   Highest education level: Not on file  Occupational History   Not on file  Tobacco Use   Smoking status: Never   Smokeless tobacco: Never  Vaping Use   Vaping Use: Every day   Substances: Nicotine, Flavoring  Substance and Sexual Activity   Alcohol use: Yes    Comment: occasionally    Drug use: Not Currently    Types: Marijuana   Sexual activity: Yes  Other Topics Concern   Not on file  Social History Narrative   Not on file   Social Determinants of Health   Financial Resource Strain: Not on file  Food Insecurity: Not on file  Transportation Needs: Not on file  Physical Activity: Not on file  Stress: Not on file  Social Connections: Not on file  Intimate Partner Violence: Not on file     Constitutional: Denies fever, malaise, fatigue, headache or abrupt weight changes.  HEENT: Patient reports left ear pain.  Denies eye pain, eye redness, ringing in the ears, wax buildup, runny nose, nasal congestion, bloody nose, or sore throat. Respiratory: Denies difficulty breathing, shortness of breath, cough or sputum production.   Cardiovascular: Denies chest pain, chest tightness, palpitations or swelling in the hands or feet.   No other specific complaints in a complete review of systems (except as listed in HPI above).  Objective:   Physical Exam   BP 112/63 (BP Location: Right Arm, Patient Position: Sitting, Cuff Size: Large)    Pulse 80    Temp (!) 97.3 F (36.3 C) (Temporal)    Wt 239 lb (108.4 kg)    SpO2 100%    BMI 38.58 kg/m   Wt Readings from Last 3 Encounters:  01/13/21 229 lb  9.6 oz (104.1 kg)  05/14/20 175 lb (79.4 kg)  05/07/20 170 lb 3.2 oz (77.2 kg)    General: Appears her stated age, obese, in NAD. HEENT: Head: normal shape and size; Eyes: sclera white and EOMs intact; Ears: Tm's gray and intact, normal light reflex;  Neck: No adenopathy noted. Cardiovascular: Normal rate. Pulmonary/Chest: Normal effort. Neurological: Alert and oriented.     BMET    Component Value Date/Time   NA 139 07/17/2015 1400   K 4.2 07/17/2015 1400   CL 109 07/17/2015 1400   CO2 22 07/17/2015 1400   GLUCOSE 94 07/17/2015 1400   BUN 8 07/17/2015 1400   CREATININE 0.84 07/17/2015 1400   CALCIUM 9.6 07/17/2015 1400   GFRNONAA NOT CALCULATED 07/17/2015 1400   GFRAA NOT CALCULATED 07/17/2015 1400    Lipid Panel  No results found for: CHOL, TRIG, HDL, CHOLHDL, VLDL, LDLCALC  CBC    Component Value Date/Time   WBC 9.6 07/17/2015 1400   RBC 5.13 07/17/2015 1400   HGB 13.4 07/17/2015 1400   HCT 40.8 07/17/2015 1400   PLT 297 07/17/2015 1400   MCV 79.4 (L) 07/17/2015 1400   MCH 26.2 07/17/2015 1400   MCHC 33.0 07/17/2015 1400   RDW 15.4 (H) 07/17/2015 1400    Hgb A1C Lab Results  Component Value Date   HGBA1C 5.1 01/13/2021           Assessment & Plan:   Otalgia, Left:  No overwhelming evidence of fluid behind the eardrum or infection Can try Flonase 1 spray left nostril 2 times daily No indication for antibiotics at this time If worsens, could consider course of steroids  Return precautions discussed  Nicki Reaper, NP This visit occurred during the SARS-CoV-2 public health emergency.  Safety protocols were in place, including screening questions prior to the visit, additional usage of staff PPE, and extensive cleaning of exam room while observing appropriate contact time as indicated for disinfecting solutions.

## 2021-03-31 NOTE — Patient Instructions (Signed)
Earache, Adult An earache, or ear pain, can be caused by many things, including: An infection. Ear wax buildup. Ear pressure. Something in the ear that should not be there (foreign body). A sore throat. Tooth problems. Jaw problems. Treatment of the earache will depend on the cause. If the cause is not clear or cannot be determined, you may need to watch your symptoms until your earache goes away or until a cause is found. Follow these instructions at home: Medicines Take or apply over-the-counter and prescription medicines only as told by your health care provider. If you were prescribed an antibiotic medicine, use it as told by your health care provider. Do not stop using the antibiotic even if you start to feel better. Do not put anything in your ear other than medicine that is prescribed by your health care provider. Managing pain If directed, apply heat to the affected area as often as told by your health care provider. Use the heat source that your health care provider recommends, such as a moist heat pack or a heating pad. Place a towel between your skin and the heat source. Leave the heat on for 20-30 minutes. Remove the heat if your skin turns bright red. This is especially important if you are unable to feel pain, heat, or cold. You may have a greater risk of getting burned. If directed, put ice on the affected area as often as told by your health care provider. To do this:   Put ice in a plastic bag. Place a towel between your skin and the bag. Leave the ice on for 20 minutes, 2-3 times a day. General instructions Pay attention to any changes in your symptoms. Try resting in an upright position instead of lying down. This may help to reduce pressure in your ear and relieve pain. Chew gum if it helps to relieve your ear pain. Treat any allergies as told by your health care provider. Drink enough fluid to keep your urine pale yellow. It is up to you to get the results of any  tests that were done. Ask your health care provider, or the department that is doing the tests, when your results will be ready. Keep all follow-up visits as told by your health care provider. This is important. Contact a health care provider if: Your pain does not improve within 2 days. Your earache gets worse. You have new symptoms. You have a fever. Get help right away if you: Have a severe headache. Have a stiff neck. Have trouble swallowing. Have redness or swelling behind your ear. Have fluid or blood coming from your ear. Have hearing loss. Feel dizzy. Summary An earache, or ear pain, can be caused by many things. Treatment of the earache will depend on the cause. Follow recommendations from your health care provider to treat your ear pain. If the cause is not clear or cannot be determined, you may need to watch your symptoms until your earache goes away or until a cause is found. Keep all follow-up visits as told by your health care provider. This is important. This information is not intended to replace advice given to you by your health care provider. Make sure you discuss any questions you have with your health care provider. Document Revised: 09/03/2018 Document Reviewed: 09/03/2018 Elsevier Patient Education  2022 Elsevier Inc.  

## 2021-04-16 ENCOUNTER — Ambulatory Visit: Payer: Medicaid Other | Admitting: Family Medicine

## 2021-06-24 ENCOUNTER — Ambulatory Visit: Payer: Medicaid Other | Admitting: Internal Medicine

## 2021-06-24 ENCOUNTER — Encounter: Payer: Self-pay | Admitting: Internal Medicine

## 2021-06-24 VITALS — BP 104/66 | HR 88 | Temp 96.8°F | Wt 251.0 lb

## 2021-06-24 DIAGNOSIS — N3001 Acute cystitis with hematuria: Secondary | ICD-10-CM

## 2021-06-24 LAB — POCT URINALYSIS DIPSTICK
Bilirubin, UA: NEGATIVE
Glucose, UA: NEGATIVE
Ketones, UA: NEGATIVE
Nitrite, UA: NEGATIVE
Protein, UA: NEGATIVE
Spec Grav, UA: <=1.005 — AB (ref 1.010–1.025)
Urobilinogen, UA: 0.2 E.U./dL
pH, UA: 7 (ref 5.0–8.0)

## 2021-06-24 MED ORDER — NITROFURANTOIN MONOHYD MACRO 100 MG PO CAPS: 100.0000 mg | ORAL_CAPSULE | Freq: Two times a day (BID) | ORAL | 0 refills | Status: DC

## 2021-06-24 NOTE — Patient Instructions (Signed)
Urinary Tract Infection, Adult A urinary tract infection (UTI) is an infection of any part of the urinary tract. The urinary tract includes: The kidneys. The ureters. The bladder. The urethra. These organs make, store, and get rid of pee (urine) in the body. What are the causes? This infection is caused by germs (bacteria) in your genital area. These germs grow and cause swelling (inflammation) of your urinary tract. What increases the risk? The following factors may make you more likely to develop this condition: Using a small, thin tube (catheter) to drain pee. Not being able to control when you pee or poop (incontinence). Being female. If you are female, these things can increase the risk: Using these methods to prevent pregnancy: A medicine that kills sperm (spermicide). A device that blocks sperm (diaphragm). Having low levels of a female hormone (estrogen). Being pregnant. You are more likely to develop this condition if: You have genes that add to your risk. You are sexually active. You take antibiotic medicines. You have trouble peeing because of: A prostate that is bigger than normal, if you are female. A blockage in the part of your body that drains pee from the bladder. A kidney stone. A nerve condition that affects your bladder. Not getting enough to drink. Not peeing often enough. You have other conditions, such as: Diabetes. A weak disease-fighting system (immune system). Sickle cell disease. Gout. Injury of the spine. What are the signs or symptoms? Symptoms of this condition include: Needing to pee right away. Peeing small amounts often. Pain or burning when peeing. Blood in the pee. Pee that smells bad or not like normal. Trouble peeing. Pee that is cloudy. Fluid coming from the vagina, if you are female. Pain in the belly or lower back. Other symptoms include: Vomiting. Not feeling hungry. Feeling mixed up (confused). This may be the first symptom in  older adults. Being tired and grouchy (irritable). A fever. Watery poop (diarrhea). How is this treated? Taking antibiotic medicine. Taking other medicines. Drinking enough water. In some cases, you may need to see a specialist. Follow these instructions at home:  Medicines Take over-the-counter and prescription medicines only as told by your doctor. If you were prescribed an antibiotic medicine, take it as told by your doctor. Do not stop taking it even if you start to feel better. General instructions Make sure you: Pee until your bladder is empty. Do not hold pee for a long time. Empty your bladder after sex. Wipe from front to back after peeing or pooping if you are a female. Use each tissue one time when you wipe. Drink enough fluid to keep your pee pale yellow. Keep all follow-up visits. Contact a doctor if: You do not get better after 1-2 days. Your symptoms go away and then come back. Get help right away if: You have very bad back pain. You have very bad pain in your lower belly. You have a fever. You have chills. You feeling like you will vomit or you vomit. Summary A urinary tract infection (UTI) is an infection of any part of the urinary tract. This condition is caused by germs in your genital area. There are many risk factors for a UTI. Treatment includes antibiotic medicines. Drink enough fluid to keep your pee pale yellow. This information is not intended to replace advice given to you by your health care provider. Make sure you discuss any questions you have with your health care provider. Document Revised: 09/08/2019 Document Reviewed: 09/08/2019 Elsevier Patient Education    2023 Elsevier Inc.  

## 2021-06-24 NOTE — Addendum Note (Signed)
Addended by: Kavin Leech E on: 06/24/2021 03:40 PM ? ? Modules accepted: Orders ? ?

## 2021-06-24 NOTE — Progress Notes (Signed)
HPI ? ?Pt presents to the clinic today with c/o urinary urgency, frequency and dysuria. She reports this started this morning. She has noticed blood in her urine. She is not currently on her menses. She denies vaginal symptoms. She denies fever, chills, nausea, vomiting or low back pain. She has not tried AZO OTC for this.  ? ? ?Review of Systems ? ?Past Medical History:  ?Diagnosis Date  ? Family history of breast cancer   ? qualifies for cancer genetic testing age 56  ? ? ?Family History  ?Problem Relation Age of Onset  ? Breast cancer Maternal Aunt   ?     30s  ? Breast cancer Other   ?     2 Aunts, both deceased  ? Breast cancer Maternal Great-grandfather   ? ? ?Social History  ? ?Socioeconomic History  ? Marital status: Single  ?  Spouse name: Not on file  ? Number of children: Not on file  ? Years of education: Not on file  ? Highest education level: Not on file  ?Occupational History  ? Not on file  ?Tobacco Use  ? Smoking status: Never  ? Smokeless tobacco: Never  ?Vaping Use  ? Vaping Use: Every day  ? Substances: Nicotine, Flavoring  ?Substance and Sexual Activity  ? Alcohol use: Yes  ?  Comment: occasionally   ? Drug use: Not Currently  ?  Types: Marijuana  ? Sexual activity: Yes  ?Other Topics Concern  ? Not on file  ?Social History Narrative  ? Not on file  ? ?Social Determinants of Health  ? ?Financial Resource Strain: Not on file  ?Food Insecurity: Not on file  ?Transportation Needs: Not on file  ?Physical Activity: Not on file  ?Stress: Not on file  ?Social Connections: Not on file  ?Intimate Partner Violence: Not on file  ? ? ?Allergies  ?Allergen Reactions  ? Hydrocodone Rash  ? ?  ?Constitutional: Denies fever, malaise, fatigue, headache or abrupt weight changes.   ?GU: Pt reports urgency, frequency, pain with urination and blood in urine. Denies burning sensation, blood in urine, odor or discharge. ?Skin: Denies redness, rashes, lesions or ulcercations.  ? ?No other specific complaints in a  complete review of systems (except as listed in HPI above). ? ?  ?Objective:  ? Physical Exam ?BP 104/66 (BP Location: Right Arm, Patient Position: Sitting, Cuff Size: Large)   Pulse 88   Temp (!) 96.8 ?F (36 ?C) (Temporal)   Wt 251 lb (113.9 kg)   SpO2 100%   BMI 40.51 kg/m?  ? ?Wt Readings from Last 3 Encounters:  ?03/31/21 239 lb (108.4 kg)  ?01/13/21 229 lb 9.6 oz (104.1 kg)  ?05/14/20 175 lb (79.4 kg)  ? ? ?General: Appears her stated age, obese, in NAD. ?Cardiovascular: Normal rate and rhythm. S1,S2 noted.   ?Pulmonary/Chest: Normal effort and positive vesicular breath sounds. No respiratory distress. No wheezes, rales or ronchi noted.  ?Abdomen: Soft. Normal bowel sounds. No distention or masses noted.  Tender to palpation over the bladder area. No CVA tenderness. ? ? ?     ?Assessment & Plan:  ? ?Urgency, Frequency, Dysuria, Blood in Urine secondary to UTI ? ?Urinalysis: Positive for leuks and blood ?Will send urine culture ?eRx sent if for Macrobid 100 mg BID x 5 days ?OK to take AZO OTC ?Drink plenty of fluids ? ?RTC as needed or if symptoms persist. ?Nicki Reaper, NP ? ?

## 2021-06-26 LAB — URINE CULTURE
MICRO NUMBER:: 13403193
SPECIMEN QUALITY:: ADEQUATE

## 2021-07-22 ENCOUNTER — Ambulatory Visit: Payer: Self-pay | Admitting: Obstetrics

## 2021-07-24 ENCOUNTER — Ambulatory Visit: Payer: Self-pay | Admitting: Obstetrics

## 2021-08-10 NOTE — Progress Notes (Signed)
PCP:  Smitty Cords, DO   Chief Complaint  Patient presents with   Gynecologic Exam    No concerns     HPI:      [name] is a 21 y.o. G1P1001 whose LMP was Patient's last menstrual period was 08/09/2021 (approximate)., presents today for her annual examination.  Her menses are regular every 28-30 days, lasting 4-6 days, mod flow.  Dysmenorrhea mild, no BTB.  Sex activity: single partner, contraception - none. Conception ok, has been off BC a couple yrs. Taking MVI.  Partner is not FOB; partner doesn't have any children. Pt is s/p CS 2018 Last Pap: 05/14/20 Results were: no abnormalities  Hx of STDs: none  There is a FH of breast cancer in her mat grt aunt and MGGM, genetic testing not indicated. There is no FH of ovarian cancer. The patient does do self-breast exams.  Tobacco use: vapes daily Alcohol use: social drinker No drug use.  Exercise: moderately active  She does get adequate calcium but not Vitamin D in her diet.  Had UTI a few wks ago, treated with abx, sx resolved.   History reviewed. No pertinent past medical history.  Past Surgical History:  Procedure Laterality Date   CESAREAN SECTION  09/2016   WISDOM TOOTH EXTRACTION      Family History  Problem Relation Age of Onset   Colon cancer Paternal Grandfather        30s   Breast cancer Other        30s   Breast cancer Maternal Great-grandmother        not sure of age    Social History   Socioeconomic History   Marital status: Single    Spouse name: Not on file   Number of children: Not on file   Years of education: Not on file   Highest education level: Not on file  Occupational History   Not on file  Tobacco Use   Smoking status: Never   Smokeless tobacco: Never  Vaping Use   Vaping Use: Every day   Substances: Nicotine, Flavoring  Substance and Sexual Activity   Alcohol use: Yes    Comment: occasionally    Drug use: Not Currently    Types: Marijuana   Sexual  activity: Yes    Birth control/protection: None  Other Topics Concern   Not on file  Social History Narrative   Not on file   Social Determinants of Health   Financial Resource Strain: Not on file  Food Insecurity: Not on file  Transportation Needs: Not on file  Physical Activity: Not on file  Stress: Not on file  Social Connections: Not on file  Intimate Partner Violence: Not on file     Current Outpatient Medications:    LORazepam (ATIVAN) 1 MG tablet, Take 1 tablet (1 mg total) by mouth every 8 (eight) hours as needed for anxiety. (Patient not taking: Reported on 08/11/2021), Disp: 15 tablet, Rfl: 0     ROS:  Review of Systems  Constitutional:  Negative for fatigue, fever and unexpected weight change.  Respiratory:  Negative for cough, shortness of breath and wheezing.   Cardiovascular:  Negative for chest pain, palpitations and leg swelling.  Gastrointestinal:  Negative for blood in stool, constipation, diarrhea, nausea and vomiting.  Endocrine: Negative for cold intolerance, heat intolerance and polyuria.  Genitourinary:  Negative for dyspareunia, dysuria, flank pain, frequency, genital sores, hematuria, menstrual problem, pelvic pain, urgency, vaginal bleeding, vaginal discharge and  vaginal pain.  Musculoskeletal:  Negative for back pain, joint swelling and myalgias.  Skin:  Negative for rash.  Neurological:  Negative for dizziness, syncope, light-headedness, numbness and headaches.  Hematological:  Negative for adenopathy.  Psychiatric/Behavioral:  Negative for agitation, confusion, sleep disturbance and suicidal ideas. The patient is not nervous/anxious.   BREAST: No symptoms   Objective: BP 100/70   Ht 5\' 6"  (1.676 m)   Wt 255 lb (115.7 kg)   LMP 08/09/2021 (Approximate)   BMI 41.16 kg/m    Physical Exam Constitutional:      Appearance: She is well-developed.  Genitourinary:     Vulva normal.     Right Labia: No rash, tenderness or lesions.    Left  Labia: No tenderness, lesions or rash.    No vaginal discharge, erythema or tenderness.      Right Adnexa: not tender and no mass present.    Left Adnexa: not tender and no mass present.    No cervical friability or polyp.     Uterus is not enlarged or tender.  Breasts:    Right: No mass, nipple discharge, skin change or tenderness.     Left: No mass, nipple discharge, skin change or tenderness.  Neck:     Thyroid: No thyromegaly.  Cardiovascular:     Rate and Rhythm: Normal rate and regular rhythm.     Heart sounds: Normal heart sounds. No murmur heard. Pulmonary:     Effort: Pulmonary effort is normal.     Breath sounds: Normal breath sounds.  Abdominal:     Palpations: Abdomen is soft.     Tenderness: There is no abdominal tenderness. There is no guarding or rebound.  Musculoskeletal:        General: Normal range of motion.     Cervical back: Normal range of motion.  Lymphadenopathy:     Cervical: No cervical adenopathy.  Neurological:     General: No focal deficit present.     Mental Status: She is alert and oriented to person, place, and time.     Cranial Nerves: No cranial nerve deficit.  Skin:    General: Skin is warm and dry.  Psychiatric:        Mood and Affect: Mood normal.        Behavior: Behavior normal.        Thought Content: Thought content normal.        Judgment: Judgment normal.  Vitals reviewed.     Assessment/Plan: Encounter for annual routine gynecological examination  Screening for STD (sexually transmitted disease) - Plan: Cervicovaginal ancillary only  Pre-conception counseling--cont MVI. Discussed semen analysis for partner as next step. Pt to f/u if he desires testing.    GYN counsel adequate intake of calcium and vitamin D, diet and exercise, vaping cessation     F/U  Return in about 1 year (around 08/12/2022).  Jennifer Buske B. Willem Klingensmith, PA-C 08/11/2021 2:59 PM

## 2021-08-11 ENCOUNTER — Ambulatory Visit (INDEPENDENT_AMBULATORY_CARE_PROVIDER_SITE_OTHER): Payer: Medicaid Other | Admitting: Obstetrics and Gynecology

## 2021-08-11 ENCOUNTER — Encounter: Payer: Self-pay | Admitting: Obstetrics and Gynecology

## 2021-08-11 ENCOUNTER — Other Ambulatory Visit (HOSPITAL_COMMUNITY)
Admission: RE | Admit: 2021-08-11 | Discharge: 2021-08-11 | Disposition: A | Discharge status: Home / Self Care | Payer: Medicaid Other | Source: Ambulatory Visit | Attending: Obstetrics | Admitting: Obstetrics

## 2021-08-11 VITALS — BP 100/70 | Ht 66.0 in | Wt 255.0 lb

## 2021-08-11 DIAGNOSIS — Z01419 Encounter for gynecological examination (general) (routine) without abnormal findings: Secondary | ICD-10-CM | POA: Diagnosis not present

## 2021-08-11 DIAGNOSIS — Z3169 Encounter for other general counseling and advice on procreation: Secondary | ICD-10-CM | POA: Diagnosis not present

## 2021-08-11 DIAGNOSIS — Z113 Encounter for screening for infections with a predominantly sexual mode of transmission: Secondary | ICD-10-CM

## 2021-08-11 NOTE — Patient Instructions (Signed)
I value your feedback and you entrusting us with your care. If you get a [facility] patient survey, I would appreciate you taking the time to let us know about your experience today. Thank you! ? ? ?

## 2021-08-13 LAB — CERVICOVAGINAL ANCILLARY ONLY
Chlamydia: NEGATIVE
Comment: NEGATIVE
Comment: NEGATIVE
Comment: NORMAL
Neisseria Gonorrhea: NEGATIVE
Trichomonas: NEGATIVE

## 2021-11-13 ENCOUNTER — Ambulatory Visit: Payer: Medicaid Other | Admitting: Family Medicine

## 2021-11-13 ENCOUNTER — Encounter: Payer: Self-pay | Admitting: Family Medicine

## 2021-11-13 VITALS — BP 125/66 | HR 76 | Ht 66.0 in | Wt 252.0 lb

## 2021-11-13 DIAGNOSIS — Z6841 Body Mass Index (BMI) 40.0 and over, adult: Secondary | ICD-10-CM | POA: Diagnosis not present

## 2021-11-13 MED ORDER — PHENTERMINE HCL 37.5 MG PO CAPS: 37.5000 mg | ORAL_CAPSULE | ORAL | 2 refills | Status: DC

## 2021-11-13 NOTE — Progress Notes (Signed)
Subjective:    Patient ID: Jennifer Bass, female    DOB: [birth year], 21 y.o.   MRN: [id number]  Jennifer Bass is a 21 y.o. female presenting on 11/13/2021 for Obesity   HPI  Morbid Obesity BMI >40 Past history of abnormal weight gain over course of past 1+ year, Wt up from 170 lbs (05/2020) to 240+ lbs (01/2021). Now up to 250+ lbs (11/2021) She has always had fluctuating weight since childhood. Seems when stressed Failed diet exercise lifestyle modifications, see below She has worked hard to improve her lifestyle Work out 3 x per week, at least 1 hour, cardio and strength resistance training Eating / Diet - she will eat mostly chicken and vegetables, limiting carbs and fried Drinking 0 calorie,  mostly water or flavored water, and some calories with coffee in AM only Not followed by any program on nutrition  Previously Tried the Wellbutrin for appetite but was ineffective for weight loss  1 prior pregnancy. She had 1 son, born 2018. But did not gain much at that time. She lost a lot of the pregnancy weight at that time.  Family history has some patients with obesity, but not all family members.  Prior TSH lab level was normal      11/13/2021   11:23 AM 03/31/2021    3:38 PM 01/13/2021    2:19 PM  Depression screen PHQ 2/9  Decreased Interest 0 0 0  Down, Depressed, Hopeless 0 0 0  PHQ - 2 Score 0 0 0  Altered sleeping 1 0 1  Tired, decreased energy 1 1 1   Change in appetite 1 1 1   Feeling bad or failure about yourself  0 0 0  Trouble concentrating 0 0 0  Moving slowly or fidgety/restless 0 0 0  Suicidal thoughts 0 0 0  PHQ-9 Score 3 2 3   Difficult doing work/chores Not difficult at all Not difficult at all Not difficult at all    Social History   Tobacco Use   Smoking status: Never   Smokeless tobacco: Never  Vaping Use   Vaping Use: Every day   Substances: Nicotine, Flavoring  Substance Use Topics   Alcohol use: Yes    Comment: occasionally    Drug use:  Not Currently    Types: Marijuana    Review of Systems Per HPI unless specifically indicated above     Objective:    BP 125/66   Pulse 76   Ht 5\' 6"  (1.676 m)   Wt 252 lb (114.3 kg)   SpO2 99%   BMI 40.67 kg/m   Wt Readings from Last 3 Encounters:  11/13/21 252 lb (114.3 kg)  08/11/21 255 lb (115.7 kg)  06/24/21 251 lb (113.9 kg)    Physical Exam Vitals and nursing note reviewed.  Constitutional:      General: She is not in acute distress.    Appearance: Normal appearance. She is well-developed. She is obese. She is not diaphoretic.     Comments: Well-appearing, comfortable, cooperative  HENT:     Head: Normocephalic and atraumatic.  Eyes:     General:        Right eye: No discharge.        Left eye: No discharge.     Conjunctiva/sclera: Conjunctivae normal.  Cardiovascular:     Rate and Rhythm: Normal rate.  Pulmonary:     Effort: Pulmonary effort is normal.  Skin:    General: Skin is warm and dry.  Findings: No erythema or rash.  Neurological:     Mental Status: She is alert and oriented to person, place, and time.  Psychiatric:        Mood and Affect: Mood normal.        Behavior: Behavior normal.        Thought Content: Thought content normal.     Comments: Well groomed, good eye contact, normal speech and thoughts       Results for orders placed or performed in visit on 08/11/21  Cervicovaginal ancillary only  Result Value Ref Range   Neisseria Gonorrhea Negative    Chlamydia Negative    Trichomonas Negative    Comment Normal Reference Range Trichomonas - Negative    Comment Normal Reference [place] Chlamydia - Negative    Comment      Normal Reference Range Neisseria Gonorrhea - Negative      Assessment & Plan:   Problem List Items Addressed This Visit   None Visit Diagnoses     Morbid obesity with BMI of 40.0-44.9, adult ([place])    -  Primary   Relevant Medications   phentermine 37.5 MG capsule       Weight management discussion  today Given dramatic worsening weight gain over past 1-2 years, discussion on med management options today  Continue improving her current lifestyle regimen with regular exercise cardio + strength training and low carb diet with high protein and inc vegetables. Her diet and lifestyle sound appropriate. Emphasis on continued improvement  Failed Wellbutrin for wt loss  Offer Phentermine 37.5mg  daily for wt loss, reviewed in detail risk benefits, discuss concerns with cardiovascular risks on med, goal is short term 1-3 month course first.  Handout with info on Wt Management referral if interested  Unfortunately Medicaid is not covering GLP1 therapy at this time. Advice on other local [facility] clinic that can offer these fee for service if interested  Meds ordered this encounter  Medications   phentermine 37.5 MG capsule    Sig: Take 1 capsule (37.5 mg total) by mouth every morning.    Dispense:  30 capsule    Refill:  2      Follow up plan: Return in about 3 months (around 02/13/2022) for 3 month follow-up Weight Management, labs AFTER.  Will place lab orders after next visit including chemistry, Lipid, A1c, Thyroid TSH  Nobie Putnam, DO [facility] 11/13/2021, 11:37 AM

## 2021-11-13 NOTE — Patient Instructions (Addendum)
Thank you for coming to the office today.   For Weight Loss / Obesity only  Wegovy (same as Ozempic) weekly injection - start 0.25mg  weekly, 1 dose per pen, single use, auto-injector  2. Saxenda - DAILY injection - start 0.6mg  injection DAILY, you can increase the dose by 1 notch or 0.6 mg per week, if you don't tolerate a dose, can reduce it the next day.  3. Contrave - oral medication, appetite suppression has wellbutrin/bupropion and naltrexone in it and it can also help with appetite, it is ordered through a speciality pharmacy.  4. Phentermine - oral medication, older generation med, "burning medication" speeds up heart / blood pressure etc, but generally fairly well tolerated for short term. 1-3 months approximately. Affordable and generic.  Future make sure your insurance has weight loss coverage  Consider [facility] for the injectable weight loss management fee for service  WEIGHT MANAGEMENT  Dr Dennard Nip  [facility] [address] [place], [place] [address] Ph: [phone]   Please schedule a Follow-up Appointment to: Return in about 3 months (around 02/13/2022) for 3 month follow-up Weight Management, labs AFTER.  If you have any other questions or concerns, please feel free to call the office or send a message through [facility]. You may also schedule an earlier appointment if necessary.  Additionally, you may be receiving a survey about your experience at our office within a few days to 1 week by e-mail or mail. We value your feedback.  Nobie Putnam, DO [facility]

## 2021-11-26 ENCOUNTER — Encounter: Payer: Self-pay | Admitting: Family Medicine

## 2021-12-12 ENCOUNTER — Encounter: Payer: Self-pay | Admitting: Internal Medicine

## 2021-12-12 ENCOUNTER — Ambulatory Visit: Payer: Medicaid Other | Admitting: Internal Medicine

## 2021-12-12 VITALS — BP 112/84 | HR 88 | Temp 96.8°F | Wt 250.0 lb

## 2021-12-12 DIAGNOSIS — N3001 Acute cystitis with hematuria: Secondary | ICD-10-CM

## 2021-12-12 DIAGNOSIS — N12 Tubulo-interstitial nephritis, not specified as acute or chronic: Secondary | ICD-10-CM | POA: Diagnosis not present

## 2021-12-12 DIAGNOSIS — N200 Calculus of kidney: Secondary | ICD-10-CM | POA: Diagnosis not present

## 2021-12-12 DIAGNOSIS — R109 Unspecified abdominal pain: Secondary | ICD-10-CM

## 2021-12-12 LAB — POCT URINALYSIS DIPSTICK
Bilirubin, UA: NEGATIVE
Glucose, UA: NEGATIVE
Ketones, UA: NEGATIVE
Protein, UA: POSITIVE — AB
Spec Grav, UA: 1.015 (ref 1.010–1.025)
Urobilinogen, UA: 0.2 E.U./dL
pH, UA: 6 (ref 5.0–8.0)

## 2021-12-12 MED ORDER — NITROFURANTOIN MONOHYD MACRO 100 MG PO CAPS: 100.0000 mg | ORAL_CAPSULE | Freq: Two times a day (BID) | ORAL | 0 refills | Status: DC

## 2021-12-12 NOTE — Progress Notes (Signed)
Subjective:    Patient ID: Jennifer Bass, female    DOB: 2000-11-16, 21 y.o.   MRN: [id number]  HPI  Patient presents to clinic today with complaint of left-sided low back pain.  This started 2 days ago.  She describes the pain as dull and achy. The pain is not worse with movement but it does radiate down her left leg. She denies numbness, tingling or weakness in the left leg. She had been constipated but reports she has had normal BM's the last few days. She denies urinary or vaginal symptoms. She is currently on her period. She has tried Tylenol OTC with minimal relief of symptoms. She denies any specific injury to the area.   Review of Systems     No past medical history on file.  Current Outpatient Medications  Medication Sig Dispense Refill   phentermine 37.5 MG capsule Take 1 capsule (37.5 mg total) by mouth every morning. 30 capsule 2   No current facility-administered medications for this visit.    Allergies  Allergen Reactions   Hydrocodone Rash    Family History  Problem Relation Age of Onset   Colon cancer Paternal Grandfather        30s   Breast cancer Other        30s   Breast cancer Maternal Great-grandmother        not sure of age    Social History   Socioeconomic History   Marital status: Single    Spouse name: Not on file   Number of children: Not on file   Years of education: Not on file   Highest education level: Not on file  Occupational History   Not on file  Tobacco Use   Smoking status: Never   Smokeless tobacco: Never  Vaping Use   Vaping Use: Every day   Substances: Nicotine, Flavoring  Substance and Sexual Activity   Alcohol use: Yes    Comment: occasionally    Drug use: Not Currently    Types: Marijuana   Sexual activity: Yes    Birth control/protection: None  Other Topics Concern   Not on file  Social History Narrative   Not on file   Social Determinants of Health   Financial Resource Strain: Not on file  Food  Insecurity: Not on file  Transportation Needs: Not on file  Physical Activity: Not on file  Stress: Not on file  Social Connections: Not on file  Intimate Partner Violence: Not on file     Constitutional: Denies fever, malaise, fatigue, headache or abrupt weight changes.  Respiratory: Denies difficulty breathing, shortness of breath, cough or sputum production.   Cardiovascular: Denies chest pain, chest tightness, palpitations or swelling in the hands or feet.  Gastrointestinal: Denies abdominal pain, bloating, constipation, diarrhea or blood in the stool.  GU: Denies urgency, frequency, pain with urination, burning sensation, blood in urine, odor or discharge. Musculoskeletal: Patient reports left-sided low back pain.  Denies decrease in range of motion, difficulty with gait, or joint swelling.  Skin: Denies redness, rashes, lesions or ulcercations.  Neurological: Denies numbness, tingling, weakness or problems with balance and coordination.    No other specific complaints in a complete review of systems (except as listed in HPI above).  Objective:   Physical Exam  BP 112/84 (BP Location: Left Arm, Patient Position: Sitting, Cuff Size: Normal)   Pulse 88   Temp (!) 96.8 F (36 C) (Temporal)   Wt 250 lb (113.4 kg)   SpO2  100%   BMI 40.35 kg/m   Wt Readings from Last 3 Encounters:  11/13/21 252 lb (114.3 kg)  08/11/21 255 lb (115.7 kg)  06/24/21 251 lb (113.9 kg)    General: Appears her stated age, obese, in NAD. Skin: Warm, dry and intact. No rashes noted. Cardiovascular: Normal rate and rhythm. S1,S2 noted.  No murmur, rubs or gallops noted.  Pulmonary/Chest: Normal effort and positive vesicular breath sounds. No respiratory distress. No wheezes, rales or ronchi noted.  Abdomen: Soft and nontender. Normal bowel sounds.  + CVA tenderness noted on the left. Musculoskeletal: Normal flexion, extension and rotation of the spine.  No bony tenderness noted over the lumbar spine.   Mild bony tenderness noted over bilateral SI joints.  Strength 5/5 BLE.  No difficulty with gait.  Neurological: Alert and oriented.   BMET    Component Value Date/Time   NA 139 07/17/2015 1400   K 4.2 07/17/2015 1400   CL 109 07/17/2015 1400   CO2 22 07/17/2015 1400   GLUCOSE 94 07/17/2015 1400   BUN 8 07/17/2015 1400   CREATININE 0.84 07/17/2015 1400   CALCIUM 9.6 07/17/2015 1400   GFRNONAA NOT CALCULATED 07/17/2015 1400   GFRAA NOT CALCULATED 07/17/2015 1400    Lipid Panel  No results found for: "CHOL", "TRIG", "HDL", "CHOLHDL", "VLDL", "LDLCALC"  CBC    Component Value Date/Time   WBC 9.6 07/17/2015 1400   RBC 5.13 07/17/2015 1400   HGB 13.4 07/17/2015 1400   HCT 40.8 07/17/2015 1400   PLT 297 07/17/2015 1400   MCV 79.4 (L) 07/17/2015 1400   MCH 26.2 07/17/2015 1400   MCHC 33.0 07/17/2015 1400   RDW 15.4 (H) 07/17/2015 1400    Hgb A1C Lab Results  Component Value Date   HGBA1C 5.1 01/13/2021            Assessment & Plan:   Left Flank Pain secondary to UTI:  Urinalysis: 3+ blood, 3+ leuks, positive nitrites We will send urine culture Encouraged her to push fluids Can take AZO OTC as needed for dysuria if present Rx for Macrobid 100 mg twice daily x5 days   Follow-up with your PCP as previously scheduled Webb Silversmith, NP

## 2021-12-12 NOTE — Patient Instructions (Signed)
Urinary Tract Infection, Adult A urinary tract infection (UTI) is an infection of any part of the urinary tract. The urinary tract includes: The kidneys. The ureters. The bladder. The urethra. These organs make, store, and get rid of pee (urine) in the body. What are the causes? This infection is caused by germs (bacteria) in your genital area. These germs grow and cause swelling (inflammation) of your urinary tract. What increases the risk? The following factors may make you more likely to develop this condition: Using a small, thin tube (catheter) to drain pee. Not being able to control when you pee or poop (incontinence). Being female. If you are female, these things can increase the risk: Using these methods to prevent pregnancy: A medicine that kills sperm (spermicide). A device that blocks sperm (diaphragm). Having low levels of a female hormone (estrogen). Being pregnant. You are more likely to develop this condition if: You have genes that add to your risk. You are sexually active. You take antibiotic medicines. You have trouble peeing because of: A prostate that is bigger than normal, if you are female. A blockage in the part of your body that drains pee from the bladder. A kidney stone. A nerve condition that affects your bladder. Not getting enough to drink. Not peeing often enough. You have other conditions, such as: Diabetes. A weak disease-fighting system (immune system). Sickle cell disease. Gout. Injury of the spine. What are the signs or symptoms? Symptoms of this condition include: Needing to pee right away. Peeing small amounts often. Pain or burning when peeing. Blood in the pee. Pee that smells bad or not like normal. Trouble peeing. Pee that is cloudy. Fluid coming from the vagina, if you are female. Pain in the belly or lower back. Other symptoms include: Vomiting. Not feeling hungry. Feeling mixed up (confused). This may be the first symptom in  older adults. Being tired and grouchy (irritable). A fever. Watery poop (diarrhea). How is this treated? Taking antibiotic medicine. Taking other medicines. Drinking enough water. In some cases, you may need to see a specialist. Follow these instructions at home:  Medicines Take over-the-counter and prescription medicines only as told by your doctor. If you were prescribed an antibiotic medicine, take it as told by your doctor. Do not stop taking it even if you start to feel better. General instructions Make sure you: Pee until your bladder is empty. Do not hold pee for a long time. Empty your bladder after sex. Wipe from front to back after peeing or pooping if you are a female. Use each tissue one time when you wipe. Drink enough fluid to keep your pee pale yellow. Keep all follow-up visits. Contact a doctor if: You do not get better after 1-2 days. Your symptoms go away and then come back. Get help right away if: You have very bad back pain. You have very bad pain in your lower belly. You have a fever. You have chills. You feeling like you will vomit or you vomit. Summary A urinary tract infection (UTI) is an infection of any part of the urinary tract. This condition is caused by germs in your genital area. There are many risk factors for a UTI. Treatment includes antibiotic medicines. Drink enough fluid to keep your pee pale yellow. This information is not intended to replace advice given to you by your health care provider. Make sure you discuss any questions you have with your health care provider. Document Revised: 09/08/2019 Document Reviewed: 09/08/2019 Elsevier Patient Education    2023 Elsevier Inc.  

## 2021-12-12 NOTE — Addendum Note (Signed)
Addended by: Ashley Royalty E on: 12/12/2021 03:27 PM   Modules accepted: Orders

## 2021-12-15 LAB — URINE CULTURE
MICRO NUMBER:: 14142408
SPECIMEN QUALITY:: ADEQUATE

## 2021-12-25 ENCOUNTER — Encounter: Payer: Self-pay | Admitting: Family Medicine

## 2021-12-25 ENCOUNTER — Telehealth (INDEPENDENT_AMBULATORY_CARE_PROVIDER_SITE_OTHER): Payer: Medicaid Other | Admitting: Family Medicine

## 2021-12-25 VITALS — Ht 66.0 in | Wt 250.0 lb

## 2021-12-25 DIAGNOSIS — H9202 Otalgia, left ear: Secondary | ICD-10-CM

## 2021-12-25 DIAGNOSIS — J0110 Acute frontal sinusitis, unspecified: Secondary | ICD-10-CM

## 2021-12-25 DIAGNOSIS — J029 Acute pharyngitis, unspecified: Secondary | ICD-10-CM | POA: Diagnosis not present

## 2021-12-25 MED ORDER — AMOXICILLIN-POT CLAVULANATE 875-125 MG PO TABS: 1.0000 | ORAL_TABLET | Freq: Two times a day (BID) | ORAL | 0 refills | Status: DC

## 2021-12-25 MED ORDER — FLUTICASONE PROPIONATE 50 MCG/ACT NA SUSP: 2.0000 | Freq: Every day | NASAL | 0 refills | Status: DC

## 2021-12-25 NOTE — Progress Notes (Signed)
Subjective:    Patient ID: Jennifer Bass, female    DOB: 13-Mar-2000, 21 y.o.   MRN: [id number]  ETTA GASSETT is a 21 y.o. female presenting on 12/25/2021 for Sore Throat  Virtual / Telehealth Encounter - Video Visit via MyChart The purpose of this virtual visit is to provide medical care while limiting exposure to the novel coronavirus (COVID19) for both patient and office staff.  Consent was obtained for remote visit:  Yes.   Answered questions that patient had about telehealth interaction:  Yes.   I discussed the limitations, risks, security and privacy concerns of performing an evaluation and management service by video/telephone. I also discussed with the patient that there may be a patient responsible charge related to this service. The patient expressed understanding and agreed to proceed.  Patient Location: Home Provider Location: [facility] (Office)  Participants in virtual visit: - Patient: Jennifer Bass - CMA: Burnell Blanks, CMA - Provider: Dr Althea Charon   HPI  Sinusitis, Pharyngitis, Ear Pain/pressure Reports symptoms started earlier this week on Monday-Tuesday with scratchy sore throat initially, then more severe sore throat pain at end of day. Past 24 hours started with Left ear pain and pressure. - Tried OTC Alka Seltzer cold and flu - Admits fatigued - Denies any fevers chills body aches      11/13/2021   11:23 AM 03/31/2021    3:38 PM 01/13/2021    2:19 PM  Depression screen PHQ 2/9  Decreased Interest 0 0 0  Down, Depressed, Hopeless 0 0 0  PHQ - 2 Score 0 0 0  Altered sleeping 1 0 1  Tired, decreased energy 1 1 1   Change in appetite 1 1 1   Feeling bad or failure about yourself  0 0 0  Trouble concentrating 0 0 0  Moving slowly or fidgety/restless 0 0 0  Suicidal thoughts 0 0 0  PHQ-9 Score 3 2 3   Difficult doing work/chores Not difficult at all Not difficult at all Not difficult at all    Social History   Tobacco Use    Smoking status: Never   Smokeless tobacco: Never  Vaping Use   Vaping Use: Every day   Substances: Nicotine, Flavoring  Substance Use Topics   Alcohol use: Yes    Comment: occasionally    Drug use: Not Currently    Types: Marijuana    Review of Systems Per HPI unless specifically indicated above     Objective:    Ht 5\' 6"  (1.676 m)   Wt 250 lb (113.4 kg)   BMI 40.35 kg/m   Wt Readings from Last 3 Encounters:  12/25/21 250 lb (113.4 kg)  12/12/21 250 lb (113.4 kg)  11/13/21 252 lb (114.3 kg)    Physical Exam  Note examination was completely remotely via video observation objective data only  Gen - well-appearing, no acute distress or apparent pain, comfortable HEENT - eyes appear clear without discharge or redness Heart/Lungs - cannot examine virtually - observed no evidence of coughing or labored breathing. Abd - cannot examine virtually  Skin - face visible today- no rash Neuro - awake, alert, oriented Psych - not anxious appearing   Results for orders placed or performed in visit on 12/12/21  Urine Culture   Specimen: Urine  Result Value Ref Range   MICRO NUMBER: 12/27/21    SPECIMEN QUALITY: Adequate    Sample Source URINE    STATUS: FINAL    ISOLATE 1: Escherichia coli (  A)       Susceptibility   Escherichia coli - URINE CULTURE, REFLEX    AMOX/CLAVULANIC <=2 Sensitive     AMPICILLIN <=2 Sensitive     AMPICILLIN/SULBACTAM <=2 Sensitive     CEFAZOLIN* <=4 Not Reportable      * For infections other than uncomplicated UTI caused by E. coli, K. pneumoniae or P. mirabilis: Cefazolin is resistant if MIC > or = 8 mcg/mL. (Distinguishing susceptible versus intermediate for isolates with MIC < or = 4 mcg/mL requires additional testing.) For uncomplicated UTI caused by E. coli, K. pneumoniae or P. mirabilis: Cefazolin is susceptible if MIC <32 mcg/mL and predicts susceptible to the oral agents cefaclor, cefdinir, cefpodoxime, cefprozil, cefuroxime,  cephalexin and loracarbef.     CEFTAZIDIME <=1 Sensitive     CEFEPIME <=1 Sensitive     CEFTRIAXONE <=1 Sensitive     CIPROFLOXACIN <=0.25 Sensitive     LEVOFLOXACIN <=0.12 Sensitive     GENTAMICIN <=1 Sensitive     IMIPENEM <=0.25 Sensitive     NITROFURANTOIN <=16 Sensitive     PIP/TAZO <=4 Sensitive     TOBRAMYCIN <=1 Sensitive     TRIMETH/SULFA* <=20 Sensitive      * For infections other than uncomplicated UTI caused by E. coli, K. pneumoniae or P. mirabilis: Cefazolin is resistant if MIC > or = 8 mcg/mL. (Distinguishing susceptible versus intermediate for isolates with MIC < or = 4 mcg/mL requires additional testing.) For uncomplicated UTI caused by E. coli, K. pneumoniae or P. mirabilis: Cefazolin is susceptible if MIC <32 mcg/mL and predicts susceptible to the oral agents cefaclor, cefdinir, cefpodoxime, cefprozil, cefuroxime, cephalexin and loracarbef. Legend: S = Susceptible  I = Intermediate R = Resistant  NS = Not susceptible * = Not tested  NR = Not reported **NN = See antimicrobic comments   POCT urinalysis dipstick  Result Value Ref Range   Color, UA     Clarity, UA     Glucose, UA Negative Negative   Bilirubin, UA Negative    Ketones, UA Negative    Spec Grav, UA 1.015 1.010 - 1.025   Blood, UA Large    pH, UA 6.0 5.0 - 8.0   Protein, UA Positive (A) Negative   Urobilinogen, UA 0.2 0.2 or 1.0 E.U./dL   Nitrite, UA Postive    Leukocytes, UA Large (3+) (A) Negative   Appearance     Odor        Assessment & Plan:   Problem List Items Addressed This Visit   None Visit Diagnoses     Acute non-recurrent frontal sinusitis    -  Primary   Relevant Medications   amoxicillin-clavulanate (AUGMENTIN) 875-125 MG tablet   fluticasone (FLONASE) 50 MCG/ACT nasal spray   Pharyngitis, unspecified etiology       Left ear pain           Consistent with acute frontal sinusitis, likely initially viral URI vs allergic rhinitis component   Plan: 1.  Reassurance, likely self-limited - no indication for antibiotics at this time but if not improved 48 hours, will agree to order Augmentin 875-125mg  PO BID x 10 days 2. Start Flonase 2 sprays in each nostril daily for next 4-6 weeks, then may stop and use seasonally or as needed OTC regimen  Return criteria reviewed  Work note   Meds ordered this encounter  Medications   amoxicillin-clavulanate (AUGMENTIN) 875-125 MG tablet    Sig: Take 1 tablet by mouth 2 (two) times daily.  Dispense:  20 tablet    Refill:  0   fluticasone (FLONASE) 50 MCG/ACT nasal spray    Sig: Place 2 sprays into both nostrils daily. Use for 4-6 weeks then stop and use seasonally or as needed.    Dispense:  16 g    Refill:  0    Follow up plan: Return if symptoms worsen or fail to improve.   Patient verbalizes understanding with the above medical recommendations including the limitation of remote medical advice.  Specific follow-up and call-back criteria were given for patient to follow-up or seek medical care more urgently if needed.  Total duration of direct patient care provided via video conference: 10 minutes   Nobie Putnam, [facility] 12/25/2021, 11:20 AM

## 2021-12-25 NOTE — Patient Instructions (Addendum)
Thank you for coming to the office today.  1. It sounds like you have a Sinusitis (Bacterial Infection) - this most likely started as an Upper Respiratory Virus that has settled into an infection. Allergies can also cause this. - If not improved within 48hours - Start Augmentin 1 pill twice daily (breakfast and dinner, with food and plenty of water) for 10 days, complete entire course, do not stop early even if feeling better Start nasal steroid Flonase 2 sprays in each nostril daily for 4-6 weeks, may repeat course seasonally or as needed  - Recommend to using Nasal Saline spray multiple times a day to help flush out congestion and clear sinuses - Improve hydration by drinking plenty of clear fluids (water, gatorade) to reduce secretions and thin congestion - Congestion draining down throat can cause irritation. May try warm herbal tea with honey, cough drops - Can take Tylenol or Ibuprofen as needed for fevers - May continue over the counter cold medicine as you are, I would not use any decongestant or mucinex longer than 7 days.   Please schedule a Follow-up Appointment to: Return if symptoms worsen or fail to improve.  If you have any other questions or concerns, please feel free to call the office or send a message through MyChart. You may also schedule an earlier appointment if necessary.  Additionally, you may be receiving a survey about your experience at our office within a few days to 1 week by e-mail or mail. We value your feedback.  Saralyn Pilar, DO [facility], [place]

## 2022-01-14 ENCOUNTER — Encounter: Payer: Self-pay | Admitting: Family Medicine

## 2022-01-14 DIAGNOSIS — L209 Atopic dermatitis, unspecified: Secondary | ICD-10-CM

## 2022-01-14 MED ORDER — TRIAMCINOLONE ACETONIDE 0.5 % EX CREA: 1.0000 | TOPICAL_CREAM | Freq: Two times a day (BID) | CUTANEOUS | 2 refills | Status: DC

## 2022-04-15 NOTE — Progress Notes (Deleted)
    Olin Hauser, DO   No chief complaint on file.   HPI:      Ms. WILBUR HA is a 22 y.o. G1P1001 whose LMP was No LMP recorded., presents today for *** E. Coli 5/23 and 11/23   There are no problems to display for this patient.   Past Surgical History:  Procedure Laterality Date   CESAREAN SECTION  09/2016   WISDOM TOOTH EXTRACTION      Family History  Problem Relation Age of Onset   Colon cancer Paternal Grandfather        68s   Breast cancer Other        34s   Breast cancer Maternal Great-grandmother        not sure of age    Social History   Socioeconomic History   Marital status: Single    Spouse name: Not on file   Number of children: Not on file   Years of education: Not on file   Highest education level: Not on file  Occupational History   Not on file  Tobacco Use   Smoking status: Never   Smokeless tobacco: Never  Vaping Use   Vaping Use: Every day   Substances: Nicotine, Flavoring  Substance and Sexual Activity   Alcohol use: Yes    Comment: occasionally    Drug use: Not Currently    Types: Marijuana   Sexual activity: Yes    Birth control/protection: None  Other Topics Concern   Not on file  Social History Narrative   Not on file   Social Determinants of Health   Financial Resource Strain: Not on file  Food Insecurity: Not on file  Transportation Needs: Not on file  Physical Activity: Not on file  Stress: Not on file  Social Connections: Not on file  Intimate Partner Violence: Not on file    Outpatient Medications Prior to Visit  Medication Sig Dispense Refill   amoxicillin-clavulanate (AUGMENTIN) 875-125 MG tablet Take 1 tablet by mouth 2 (two) times daily. 20 tablet 0   fluticasone (FLONASE) 50 MCG/ACT nasal spray Place 2 sprays into both nostrils daily. Use for 4-6 weeks then stop and use seasonally or as needed. 16 g 0   nitrofurantoin, macrocrystal-monohydrate, (MACROBID) 100 MG capsule Take 1 capsule (100 mg  total) by mouth 2 (two) times daily. 10 capsule 0   phentermine 37.5 MG capsule Take 1 capsule (37.5 mg total) by mouth every morning. 30 capsule 2   triamcinolone cream (KENALOG) 0.5 % Apply 1 Application topically 2 (two) times daily. To affected areas, for up to 2 weeks. 30 g 2   No facility-administered medications prior to visit.      ROS:  Review of Systems BREAST: No symptoms   OBJECTIVE:   Vitals:  There were no vitals taken for this visit.  Physical Exam  Results: No results found for this or any previous visit (from the past 24 hour(s)).   Assessment/Plan: No diagnosis found.    No orders of the defined types were placed in this encounter.     No follow-ups on file.  Gerri Acre B. [place] Warriner, PA-C 04/15/2022 4:31 PM

## 2022-04-16 ENCOUNTER — Ambulatory Visit: Payer: Medicaid Other | Admitting: Obstetrics and Gynecology

## 2022-05-18 ENCOUNTER — Ambulatory Visit
Admission: EM | Admit: 2022-05-18 | Discharge: 2022-05-18 | Disposition: A | Discharge status: Home / Self Care | Payer: Medicaid Other | Attending: Urgent Care | Admitting: Urgent Care

## 2022-05-18 ENCOUNTER — Ambulatory Visit: Payer: Self-pay

## 2022-05-18 DIAGNOSIS — T7840XA Allergy, unspecified, initial encounter: Secondary | ICD-10-CM | POA: Diagnosis not present

## 2022-05-18 MED ORDER — METHYLPREDNISOLONE ACETATE 40 MG/ML IJ SUSP
40.0000 mg | Freq: Once | INTRAMUSCULAR | Status: AC
  Administered 2022-05-18: 40 mg via INTRAMUSCULAR

## 2022-05-18 MED ORDER — METHYLPREDNISOLONE 4 MG PO TBPK
ORAL_TABLET | ORAL | 0 refills | Status: DC
Start: 2022-05-18 — End: 2022-06-04

## 2022-05-18 NOTE — Telephone Encounter (Signed)
  Chief Complaint: Damage to scalp for hair dye. Allergic reaction? Pt reports large area of head with bumps, possibly pus beneath skin. Symptoms:  Frequency: yesterday Pertinent Negatives: Patient denies Fever Disposition: [] ED /[x] Urgent Care (no appt availability in office) / [] Appointment(In office/virtual)/ []  [facility]/ [] Home Care/ [] Refused Recommended Disposition /[] [facility]/ []  Follow-up with PCP Additional Notes: PT has had a similar response to hair dye in the past. PT dyed her hair yesterday. Today most of her head in covered in lumps, which may have pus in them. Pt use cream prescribed in the past for this - pt thinks the cream made it worse.  Sent Teams message to see if OV available today. No response. PT will go to UC for care.  Reason for Disposition  [1] Looks infected AND [2] large red area (> 2 inches or 5 cm) or streak  Answer Assessment - Initial Assessment Questions 1. APPEARANCE of INJURY: "What does the injury look like?"      Both sides of head right side is worse 2. SIZE: "How large is the cut?"      Most of head 3. BLEEDING: "Is it bleeding now?" If Yes, ask: "Is it difficult to stop?"      no 4. LOCATION: "Where is the injury located?"      Most of her head 5. ONSET: "How long ago did the injury occur?"      yesterday 6. MECHANISM: "Tell me how it happened."      Dyed hair  Protocols used: Skin Injury-A-AH

## 2022-05-18 NOTE — ED Triage Notes (Signed)
Patient presents to UC for allergic reaction to hair dye. Noted blisters and hives on scalp. States she has a hx of scalp psoriasis. Taking benadryl, kenalog, and ibuprofen.

## 2022-05-18 NOTE — ED Provider Notes (Addendum)
Jennifer Bass    CSN: [id number] Arrival date & time: 05/18/22  1150      History   Chief Complaint Chief Complaint  Patient presents with   Allergic Reaction    HPI Jennifer Bass is a 22 y.o. female.    Allergic Reaction   Presents to urgent care for allergic reaction to hair dye.  She reports blistering and hives on her scalp.  Endorses history of scalp psoriasis.  Using Benadryl, Kenalog, ibuprofen.  History reviewed. No pertinent past medical history.  There are no problems to display for this patient.   Past Surgical History:  Procedure Laterality Date   CESAREAN SECTION  09/2016   WISDOM TOOTH EXTRACTION      OB History     Gravida  1   Para  1   Term  1   Preterm      AB      Living  1      SAB      IAB      Ectopic      Multiple      Live Births  1            Home Medications    Prior to Admission medications   Medication Sig Start Date End Date Taking? Authorizing Provider  amoxicillin-clavulanate (AUGMENTIN) 875-125 MG tablet Take 1 tablet by mouth 2 (two) times daily. 12/25/21   Karamalegos, Netta Neat, DO  fluticasone (FLONASE) 50 MCG/ACT nasal spray Place 2 sprays into both nostrils daily. Use for 4-6 weeks then stop and use seasonally or as needed. 12/25/21   Karamalegos, Netta Neat, DO  nitrofurantoin, macrocrystal-monohydrate, (MACROBID) 100 MG capsule Take 1 capsule (100 mg total) by mouth 2 (two) times daily. 12/12/21   Lorre Munroe, NP  phentermine 37.5 MG capsule Take 1 capsule (37.5 mg total) by mouth every morning. 11/13/21   Karamalegos, Netta Neat, DO  triamcinolone cream (KENALOG) 0.5 % Apply 1 Application topically 2 (two) times daily. To affected areas, for up to 2 weeks. 01/14/22   Karamalegos, Netta Neat, DO  etonogestrel (NEXPLANON) 68 MG IMPL implant 1 each by Subdermal route once.  02/25/19  [provider]    Family History Family History  Problem Relation Age of Onset   Colon  cancer Paternal Grandfather        30s   Breast cancer Other        30s   Breast cancer Maternal Great-grandmother        not sure of age    Social History Social History   Tobacco Use   Smoking status: Never   Smokeless tobacco: Never  Vaping Use   Vaping Use: Every day   Substances: Nicotine, Flavoring  Substance Use Topics   Alcohol use: Yes    Comment: occasionally    Drug use: Not Currently    Types: Marijuana     Allergies   Hydrocodone   Review of Systems Review of Systems   Physical Exam Triage Vital Signs ED Triage Vitals  Enc Vitals Group     BP 05/18/22 1205 105/68     Pulse Rate 05/18/22 1205 77     Resp 05/18/22 1205 18     Temp 05/18/22 1205 98 F (36.7 C)     Temp Source 05/18/22 1205 Temporal     SpO2 05/18/22 1205 98 %     Weight --      Height --      Head  Circumference --      Peak Flow --      Pain Score 05/18/22 1204 7     Pain Loc --      Pain Edu? --      Excl. in GC? --    No data found.  Updated Vital Signs BP 105/68 (BP Location: Left Arm)   Pulse 77   Temp 98 F (36.7 C) (Temporal)   Resp 18   LMP 05/05/2022 (Approximate)   SpO2 98%   Visual Acuity Right Eye Distance:   Left Eye Distance:   Bilateral Distance:    Right Eye Near:   Left Eye Near:    Bilateral Near:     Physical Exam Vitals reviewed.  Constitutional:      General: She is not in acute distress.    Appearance: Normal appearance.  Skin:    Findings: Erythema and rash present. Rash is scaling and vesicular.  Neurological:     General: No focal deficit present.     Mental Status: She is alert and oriented to person, place, and time.  Psychiatric:        Mood and Affect: Mood normal.        Behavior: Behavior normal.      UC Treatments / Results  Labs (all labs ordered are listed, but only abnormal results are displayed) Labs Reviewed - No data to display  EKG   Radiology No results found.  Procedures Procedures (including  critical care time)  Medications Ordered in UC Medications - No data to display  Initial Impression / Assessment and Plan / UC Course  I have reviewed the triage vital signs and the nursing notes.  Pertinent labs & imaging results that were available during my care of the patient were reviewed by me and considered in my medical decision making (see chart for details).   Presents with presumed allergic reaction caused by contact with hair dye.  Patient has developed a severe erythematous, scaling rash covering her entire scalp.  Treating in clinic with methylprednisolone 80 mg IM and discharging with a Medrol Dosepak.  Patient will continue to use Benadryl at nighttime for relief of symptoms.   Final Clinical Impressions(s) / UC Diagnoses   Final diagnoses:  None   Discharge Instructions   None    ED Prescriptions   None    PDMP not reviewed this encounter.   Charma Igo, FNP 05/18/22 1248    ImmordinoJeannett Senior, FNP 05/18/22 1249

## 2022-05-18 NOTE — Discharge Instructions (Addendum)
Follow up here or with your primary care provider if your symptoms are worsening or not improving.     

## 2022-05-20 ENCOUNTER — Ambulatory Visit: Payer: Self-pay

## 2022-05-20 ENCOUNTER — Telehealth: Payer: Medicaid Other | Admitting: Nurse Practitioner

## 2022-05-20 DIAGNOSIS — T7840XD Allergy, unspecified, subsequent encounter: Secondary | ICD-10-CM | POA: Diagnosis not present

## 2022-05-20 DIAGNOSIS — L03811 Cellulitis of head [any part, except face]: Secondary | ICD-10-CM

## 2022-05-20 MED ORDER — CEPHALEXIN 500 MG PO CAPS
500.0000 mg | ORAL_CAPSULE | Freq: Three times a day (TID) | ORAL | 0 refills | Status: DC
Start: 2022-05-20 — End: 2022-06-04

## 2022-05-20 NOTE — Telephone Encounter (Signed)
     Chief Complaint: Rash to neck, chest. Started Prednisone 05/18/22.Red splotches. Symptoms: Itchy Frequency: 05/19/22 Pertinent Negatives: Patient denies any other symptoms Disposition: [] ED /[] Urgent Care (no appt availability in office) / [] Appointment(In office/virtual)/ [x]  [facility]/ [] Home Care/ [] Refused Recommended Disposition /[] [facility]/ []  Follow-up with PCP Additional Notes: No availability in the practice.   Reason for Disposition  [1] Taking new prescription medication AND [2] rash within 4 hours of 1st dose  Answer Assessment - Initial Assessment Questions 1. APPEARANCE of RASH: "Describe the rash." (e.g., spots, blisters, raised areas, skin peeling, scaly)     Red splotches  2. SIZE: "How big are the spots?" (e.g., tip of pen, eraser, coin; inches, centimeters)     Small 3. LOCATION: "Where is the rash located?"     Neck and chest, better now 4. COLOR: "What color is the rash?" (Note: It is difficult to assess rash color in people with darker-colored skin. When this situation occurs, simply ask the caller to describe what they see.)     Red 5. ONSET: "When did the rash begin?"     Yesterday 6. FEVER: "Do you have a fever?" If Yes, ask: "What is your temperature, how was it measured, and when did it start?"     No 7. ITCHING: "Does the rash itch?" If Yes, ask: "How bad is the itch?" (Scale 1-10; or mild, moderate, severe)     8 8. CAUSE: "What do you think is causing the rash?"     Prednisone 9. NEW MEDICINES: "What new medicines are you taking?" (e.g., name of antibiotic) "When did you start taking this medication?".     Prednisone 10. OTHER SYMPTOMS: "Do you have any other symptoms?" (e.g., sore throat, fever, joint pain)       No 11. PREGNANCY: "Is there any chance you are pregnant?" "When was your last menstrual period?"       no  Protocols used: Rash - Widespread On Drugs-A-AH

## 2022-05-20 NOTE — Progress Notes (Signed)
Virtual Visit Consent   Jennifer Bass, you are scheduled for a virtual visit with Mary-Margaret Daphine Deutscher, FNP, a [facility] provider, today.     Just as with appointments in the office, your consent must be obtained to participate.  Your consent will be active for this visit and any virtual visit you may have with one of our providers in the next 365 days.     If you have a MyChart account, a copy of this consent can be sent to you electronically.  All virtual visits are billed to your insurance company just like a traditional visit in the office.    As this is a virtual visit, video technology does not allow for your provider to perform a traditional examination.  This may limit your provider's ability to fully assess your condition.  If your provider identifies any concerns that need to be evaluated in person or the need to arrange testing (such as labs, EKG, etc.), we will make arrangements to do so.     Although advances in technology are sophisticated, we cannot ensure that it will always work on either your end or our end.  If the connection with a video visit is poor, the visit may have to be switched to a telephone visit.  With either a video or telephone visit, we are not always able to ensure that we have a secure connection.     I need to obtain your verbal consent now.   Are you willing to proceed with your visit today? YES   Ozelia Friess Sokol has provided verbal consent on 05/20/2022 for a virtual visit (video or telephone).   Mary-Margaret Daphine Deutscher, FNP   Date: 05/20/2022 1:23 PM   Virtual Visit via Video Note   I, Mary-Margaret Daphine Deutscher, connected with Jennifer Bass ([id number], 21-Nov-2000) on 05/20/22 at  1:30 PM EDT by a video-enabled telemedicine application and verified that I am speaking with the correct person using two identifiers.  Location: Patient: Virtual Visit Location Patient: Home Provider: Virtual Visit Location Provider: Mobile   I discussed the limitations of  evaluation and management by telemedicine and the availability of in person appointments. The patient expressed understanding and agreed to proceed.    History of Present Illness: Jennifer Bass is a 22 y.o. who identifies as a female who was assigned female at birth, and is being seen today for rash.  HPI: Patient went to ED on 04/17/22 with allergic reaction to hair dye. She was treated with steroid dose pack. Now she has rash all  over and is very itchy with some hamd swelling. She thinks sheis reacting to steroids. She also says herscalp reaction from hair dye is now oozing and matting her hair at sacolpdespite washing it several times a day.     ROS  Problems: There are no problems to display for this patient.   Allergies:  Allergies  Allergen Reactions   Hydrocodone Rash   Medications:  Current Outpatient Medications:    amoxicillin-clavulanate (AUGMENTIN) 875-125 MG tablet, Take 1 tablet by mouth 2 (two) times daily., Disp: 20 tablet, Rfl: 0   fluticasone (FLONASE) 50 MCG/ACT nasal spray, Place 2 sprays into both nostrils daily. Use for 4-6 weeks then stop and use seasonally or as needed., Disp: 16 g, Rfl: 0   methylPREDNISolone (MEDROL DOSEPAK) 4 MG TBPK tablet, Take as directed on package., Disp: 21 tablet, Rfl: 0   nitrofurantoin, macrocrystal-monohydrate, (MACROBID) 100 MG capsule, Take 1 capsule (100 mg total) by  mouth 2 (two) times daily., Disp: 10 capsule, Rfl: 0   phentermine 37.5 MG capsule, Take 1 capsule (37.5 mg total) by mouth every morning., Disp: 30 capsule, Rfl: 2   triamcinolone cream (KENALOG) 0.5 %, Apply 1 Application topically 2 (two) times daily. To affected areas, for up to 2 weeks., Disp: 30 g, Rfl: 2  Observations/Objective: Patient is well-developed, well-nourished in no acute distress.  Resting comfortably  at home.  Head is normocephalic, atraumatic.  No labored breathing.  Speech is clear and coherent with logical content.  Patient is alert and  oriented at baseline.  Red splotchy rash on anterior chest. Hands appear mild edematous Hair appears matted in back- no rash visible on scalp.  Assessment and Plan:  LIZZA SHIMAZU in today with chief complaint of Rash   1. Allergic reaction, subsequent encounter 24 hour zyrtec OTC Stop prednisone Avoid scratching Cool compresses If worsens NTBS  2. Cellulitis of head or scalp Wash hair again with baby shampoo Avoid picking or scratching at scalp - cephALEXin (KEFLEX) 500 MG capsule; Take 1 capsule (500 mg total) by mouth 3 (three) times daily.  Dispense: 21 capsule; Refill: 0   Follow Up Instructions: I discussed the assessment and treatment plan with the patient. The patient was provided an opportunity to ask questions and all were answered. The patient agreed with the plan and demonstrated an understanding of the instructions.  A copy of instructions were sent to the patient via MyChart.  The patient was advised to call back or seek an in-person evaluation if the symptoms worsen or if the condition fails to improve as anticipated.  Time:  I spent 12 minutes with the patient via telehealth technology discussing the above problems/concerns.    Mary-Margaret Daphine Deutscher, FNP

## 2022-05-20 NOTE — Patient Instructions (Signed)
  Marlana Salvage Louderback, thank you for joining Bennie Pierini, FNP for today's virtual visit.  While this provider is not your primary care provider (PCP), if your PCP is located in our provider database this encounter information will be shared with them immediately following your visit.   A [facility] MyChart account gives you access to today's visit and all your visits, tests, and labs performed at [facility] " click here if you don't have a [facility] MyChart account or go to [email or web address]  Consent: (Patient) Ijahnae Gugel Scroggins provided verbal consent for this virtual visit at the beginning of the encounter.  Current Medications:  Current Outpatient Medications:    cephALEXin (KEFLEX) 500 MG capsule, Take 1 capsule (500 mg total) by mouth 3 (three) times daily., Disp: 21 capsule, Rfl: 0   amoxicillin-clavulanate (AUGMENTIN) 875-125 MG tablet, Take 1 tablet by mouth 2 (two) times daily., Disp: 20 tablet, Rfl: 0   fluticasone (FLONASE) 50 MCG/ACT nasal spray, Place 2 sprays into both nostrils daily. Use for 4-6 weeks then stop and use seasonally or as needed., Disp: 16 g, Rfl: 0   methylPREDNISolone (MEDROL DOSEPAK) 4 MG TBPK tablet, Take as directed on package., Disp: 21 tablet, Rfl: 0   nitrofurantoin, macrocrystal-monohydrate, (MACROBID) 100 MG capsule, Take 1 capsule (100 mg total) by mouth 2 (two) times daily., Disp: 10 capsule, Rfl: 0   phentermine 37.5 MG capsule, Take 1 capsule (37.5 mg total) by mouth every morning., Disp: 30 capsule, Rfl: 2   triamcinolone cream (KENALOG) 0.5 %, Apply 1 Application topically 2 (two) times daily. To affected areas, for up to 2 weeks., Disp: 30 g, Rfl: 2   Medications ordered in this encounter:  Meds ordered this encounter  Medications   cephALEXin (KEFLEX) 500 MG capsule    Sig: Take 1 capsule (500 mg total) by mouth 3 (three) times daily.    Dispense:  21 capsule    Refill:  0    Order Specific Question:   Supervising  Provider    Answer:   Merrilee Jansky [id number]     *If you need refills on other medications prior to your next appointment, please contact your pharmacy*  Follow-Up: Call back or seek an in-person evaluation if the symptoms worsen or if the condition fails to improve as anticipated.  [facility] [phone]  Other Instructions Avoid scratching Cool compresses Stop prednisone 24 hour zyrtec Face to face visit if not improving.   If you have been instructed to have an in-person evaluation today at a local Urgent Care facility, please use the link below. It will take you to a list of all of our available [facility], including address, phone number and hours of operation. Please do not delay care.  [facility]  If you or a family member do not have a primary care provider, use the link below to schedule a visit and establish care. When you choose a [facility] primary care physician or advanced practice provider, you gain a long-term partner in health. Find a Primary Care Provider  Learn more about [facility]'s in-office and virtual care options: [facility] - Get Care Now

## 2022-05-20 NOTE — Telephone Encounter (Signed)
noted 

## 2022-05-21 ENCOUNTER — Ambulatory Visit: Payer: Self-pay

## 2022-05-27 ENCOUNTER — Ambulatory Visit
Admission: EM | Admit: 2022-05-27 | Discharge: 2022-05-27 | Disposition: A | Discharge status: Home / Self Care | Payer: Medicaid Other | Attending: Emergency Medicine | Admitting: Emergency Medicine

## 2022-05-27 DIAGNOSIS — R21 Rash and other nonspecific skin eruption: Secondary | ICD-10-CM | POA: Diagnosis not present

## 2022-05-27 MED ORDER — HYDROXYZINE HCL 25 MG PO TABS: 25.0000 mg | ORAL_TABLET | Freq: Four times a day (QID) | ORAL | 0 refills | Status: DC

## 2022-05-27 MED ORDER — PREDNISONE 10 MG (21) PO TBPK: ORAL_TABLET | Freq: Every day | ORAL | 0 refills | Status: DC

## 2022-05-27 MED ORDER — CLOBETASOL PROPIONATE 0.05 % EX CREA: 1.0000 | TOPICAL_CREAM | Freq: Two times a day (BID) | CUTANEOUS | 0 refills | Status: DC

## 2022-05-27 NOTE — Discharge Instructions (Addendum)
Rash appears to be consistent with psoriasis and was most likely irritated by presence of hair dye  Today will attempt use of a different steroid that you have tolerated in the past, begin prednisone every morning as directed to reduce the inflammatory process that occurs with rashes, if you begin to have any form of reaction, stop medication and call urgent care for further management  You have been prescribed clobetasol cream which is a higher strength of steroid than your current triamcinolone, may use twice daily over the affected area to further help calm symptoms  You may use hydroxyzine every 6 hours as needed to help manage itching  Some research shows that psoriasis rashes benefit from exposure to sunlight , sit outside with exposure to the sunlight in 10 to 15-minute intervals, ensure that you are wearing sunscreen  Schedule follow-up appointment with your primary doctor in 7 days for reevaluation because of your symptoms continue to persist she will need follow-up with the dermatologist who is the specialist

## 2022-05-27 NOTE — ED Provider Notes (Signed)
[facility]    CSN: [id number] Arrival date & time: 05/27/22  1052      History   Chief Complaint Chief Complaint  Patient presents with   Rash    HPI Jennifer Bass is a 22 y.o. female.   Patient presents for evaluation of her persisting rash to the scalp present for 9 days.  Currently present on chest and back.  Began after using hair dye.  Has had blistering to the scalp with drainage.  Was evaluated in this urgent care 9 days ago, started on methylprednisolone, endorses worsening rash and bilateral hand swelling and was advised to discontinue medication, had taken 1 day dose.  Completed e-visit 7 days ago and due to concerns of infection was started on Keflex, currently taking medication as directed.  Endorses severe pruritus.  Has not attempted additional treatment.   History reviewed. No pertinent past medical history.  There are no problems to display for this patient.   Past Surgical History:  Procedure Laterality Date   CESAREAN SECTION  09/2016   WISDOM TOOTH EXTRACTION      OB History     Gravida  1   Para  1   Term  1   Preterm      AB      Living  1      SAB      IAB      Ectopic      Multiple      Live Births  1            Home Medications    Prior to Admission medications   Medication Sig Start Date End Date Taking? Authorizing Provider  cephALEXin (KEFLEX) 500 MG capsule Take 1 capsule (500 mg total) by mouth 3 (three) times daily. 05/20/22  Yes Daphine Deutscher, Mary-Margaret, FNP  amoxicillin-clavulanate (AUGMENTIN) 875-125 MG tablet Take 1 tablet by mouth 2 (two) times daily. 12/25/21   Karamalegos, Netta Neat, DO  fluticasone (FLONASE) 50 MCG/ACT nasal spray Place 2 sprays into both nostrils daily. Use for 4-6 weeks then stop and use seasonally or as needed. 12/25/21   Karamalegos, Netta Neat, DO  methylPREDNISolone (MEDROL DOSEPAK) 4 MG TBPK tablet Take as directed on package. 05/18/22   Immordino, Jeannett Senior, FNP   nitrofurantoin, macrocrystal-monohydrate, (MACROBID) 100 MG capsule Take 1 capsule (100 mg total) by mouth 2 (two) times daily. 12/12/21   Lorre Munroe, NP  phentermine 37.5 MG capsule Take 1 capsule (37.5 mg total) by mouth every morning. 11/13/21   Karamalegos, Netta Neat, DO  triamcinolone cream (KENALOG) 0.5 % Apply 1 Application topically 2 (two) times daily. To affected areas, for up to 2 weeks. 01/14/22   Karamalegos, Netta Neat, DO  etonogestrel (NEXPLANON) 68 MG IMPL implant 1 each by Subdermal route once.  02/25/19  [provider]    Family History Family History  Problem Relation Age of Onset   Colon cancer Paternal Grandfather        30s   Breast cancer Other        30s   Breast cancer Maternal Great-grandmother        not sure of age    Social History Social History   Tobacco Use   Smoking status: Never   Smokeless tobacco: Never  Vaping Use   Vaping Use: Every day   Substances: Nicotine, Flavoring  Substance Use Topics   Alcohol use: Yes    Comment: occasionally    Drug use: Not Currently  Types: Marijuana     Allergies   Hydrocodone   Review of Systems Review of Systems  Skin:  Positive for rash.     Physical Exam Triage Vital Signs ED Triage Vitals  Enc Vitals Group     BP 05/27/22 1104 111/68     Pulse Rate 05/27/22 1104 75     Resp --      Temp 05/27/22 1104 98 F (36.7 C)     Temp Source 05/27/22 1104 Oral     SpO2 05/27/22 1104 97 %     Weight 05/27/22 1103 230 lb (104.3 kg)     Height 05/27/22 1103  (1.676 m)     Head Circumference --      Peak Flow --      Pain Score 05/27/22 1102 9     Pain Loc --      Pain Edu? --      Excl. in GC? --    No data found.  Updated Vital Signs BP 111/68 (BP Location: Left Arm)   Pulse 75   Temp 98 F (36.7 C) (Oral)   Ht  (1.676 m)   Wt 230 lb (104.3 kg)   LMP 05/05/2022 (Approximate)   SpO2 97%   BMI 37.12 kg/m   Visual Acuity Right Eye Distance:   Left Eye  Distance:   Bilateral Distance:    Right Eye Near:   Left Eye Near:    Bilateral Near:     Physical Exam Constitutional:      Appearance: Normal appearance.  Eyes:     Extraocular Movements: Extraocular movements intact.  Pulmonary:     Effort: Pulmonary effort is normal.  Skin:    Comments: Erythematous flaking pustules present throughout the scalp, along the posterior of the neck, the upper back and the center of the chest wall  Neurological:     Mental Status: She is alert and oriented to person, place, and time. Mental status is at baseline.      UC Treatments / Results  Labs (all labs ordered are listed, but only abnormal results are displayed) Labs Reviewed - No data to display  EKG   Radiology No results found.  Procedures Procedures (including critical care time)  Medications Ordered in UC Medications - No data to display  Initial Impression / Assessment and Plan / UC Course  I have reviewed the triage vital signs and the nursing notes.  Pertinent labs & imaging results that were available during my care of the patient were reviewed by me and considered in my medical decision making (see chart for details).  Rash  Presentation is consistent with psoriasis most likely flared due to hair dye, has had reaction to hair dye in the past per chart review, patient has taken prednisone in the past without complication therefore will attempt use of steroid again, taper prescribed and discussed administration, additionally prescribed clobetasol, has been using triamcinolone for management of psoriasis in the past, prescribed hydroxyzine for management of pruritus follow-up appointment with PCP in 7 days for reevaluation of symptoms and for referral to dermatology if symptoms continue to persist, may follow-up with his urgent care as needed Final Clinical Impressions(s) / UC Diagnoses   Final diagnoses:  None   Discharge Instructions   None    ED Prescriptions    None    PDMP not reviewed this encounter.   Valinda Hoar, NP 05/27/22 1352

## 2022-05-27 NOTE — ED Triage Notes (Signed)
Pt presents to UC pt states she originally came previously to UC for allergic reaction from dying her hair. Pt states she was given steroid shot & prednisone rx but states she broke out in rash & hand swelling. Pt states she is itching all over body, rash on chest and all over back.

## 2022-06-04 ENCOUNTER — Ambulatory Visit: Payer: Medicaid Other | Admitting: Family Medicine

## 2022-06-04 ENCOUNTER — Encounter: Payer: Self-pay | Admitting: Family Medicine

## 2022-06-04 VITALS — BP 110/60 | HR 82 | Ht 66.0 in | Wt 238.0 lb

## 2022-06-04 DIAGNOSIS — L209 Atopic dermatitis, unspecified: Secondary | ICD-10-CM | POA: Diagnosis not present

## 2022-06-04 DIAGNOSIS — L409 Psoriasis, unspecified: Secondary | ICD-10-CM

## 2022-06-04 NOTE — Patient Instructions (Addendum)
Thank you for coming to the office today.  The rash looks most consistent with eczema dermatitis rash OR it could actually be side effect from the Keflex antibiotic. Regardless it should continue to fade.  It can flare and get worse due to a variety of factors (excessive dry skin from bathing/showering, soaps, cold weather / indoor heaters, outdoor exposures).  Less likely heat rash, this is more in skin folds and does not appear to be consistent.  Pause on further antibiotics steroids etc.  I would recommend topical Clobetasol that you already have. Twice a day on these areas for 7 days.  Use the topical steroid creams twice a day for up to 1 week, maximum duration of use per one flare is 10 to 14 days, then STOP using it and allow skin to recover. Caution with over-use may cause lightening of the skin.   Tips to reduce Eczema Flares: For baths/showers, limit bathing to every other day if you can (max 1 x daily)  Use a gentle, unscented soap and lukewarm water (hot water is most irritating to skin) Never scrub skin with too much pressure, this causes more irritation. Pat skin dry, then leave it slightly damp. DO NOT scrub it dry. Apply steroid cream to skin and rub in all the way, wait 15 min, then apply a daily moisturizer (Vaseline, Eucerin, Aveeno). Continue daily moisturizer every day of the year (even after flare is resolved) - If you have eczema on hands or dry hands, recommend wearing any type of gloves overnight (cloth, fabric, or even nitrile/latex) to improve effect of topical moisturizer  If develops redness, honey colored crust oozing, drainage of pus, bleeding, or redness / swelling, pain, please return for re-evaluation, may have become infected after scratching.  From order of less wait time to longest  [facility] in [employer], [place] Address: [address], [place], [place] [address] Phone: [phone]  [facility] [address]  [place], [place] [address] Phone: [phone]  [facility]   [address] [place], [place] [address] Hours: 8AM-5PM Phone: [phone]  [facility] Address: [facility], [address], [place], [place] [address] Phone: [phone]  [facility] Dermatologist in [place], [place] Address: [address], [place], [place] [address] Phone: [phone]  ---------------------  Please check into other locations if you find one that accepts new patients / medicaid sooner, let me know!   Please schedule a Follow-up Appointment to: Return if symptoms worsen or fail to improve.  If you have any other questions or concerns, please feel free to call the office or send a message through MyChart. You may also schedule an earlier appointment if necessary.  Additionally, you may be receiving a survey about your experience at our office within a few days to 1 week by e-mail or mail. We value your feedback.  Jennifer Pilar, DO [facility], [place]

## 2022-06-04 NOTE — Progress Notes (Signed)
Subjective:    Patient ID: Jennifer Bass, female    DOB: August 25, 2000, 22 y.o.   MRN: [id number]  Jennifer Bass is a 22 y.o. female presenting on 06/04/2022 for Allergic Reaction   HPI  Scalp Psoriasis Atopic Dermatitis  Within past few weeks, new rash / scalp psoriasis flare, Initial onset with hair dye and flare up of scalp psoriasis Tried Benadryl, Topical Kenalog, Ibuprofen.  Urgent Care visit 05/18/22 -  She was given injection Methylprednisolone, then discharged with oral methylprednisone. And she had other swelling reaction.  05/20/22 she called back with worsening generalized rest of body hives urticarial rash. She could not take the Methylprednisolone. Stopped that.  05/20/22 virtual video visit - Tried Zyrtec 24 hr + Keflex for scalp.  05/27/22 [facility] - re-evaluation, she had blistering scalp rash with psoriasis, neck and upper back and chest with rash. Switched to topical clobetasol, hydroxyzine, regular prednisone  Tried Benadryl cream topical   Today overall scalp has improved. Now has patchy rough maculopapular rash on inner thigh bilateral legs and some on R under arm. She has not put anything on it yet. It has not had any pustules or drainage or pain. Admits itching rash.     11/13/2021   11:23 AM 03/31/2021    3:38 PM 01/13/2021    2:19 PM  Depression screen PHQ 2/9  Decreased Interest 0 0 0  Down, Depressed, Hopeless 0 0 0  PHQ - 2 Score 0 0 0  Altered sleeping 1 0 1  Tired, decreased energy 1 1 1   Change in appetite 1 1 1   Feeling bad or failure about yourself  0 0 0  Trouble concentrating 0 0 0  Moving slowly or fidgety/restless 0 0 0  Suicidal thoughts 0 0 0  PHQ-9 Score 3 2 3   Difficult doing work/chores Not difficult at all Not difficult at all Not difficult at all    Social History   Tobacco Use   Smoking status: Never   Smokeless tobacco: Never  Vaping Use   Vaping Use: Every day   Substances: Nicotine, Flavoring  Substance Use  Topics   Alcohol use: Yes    Comment: occasionally    Drug use: Not Currently    Types: Marijuana    Review of Systems Per HPI unless specifically indicated above     Objective:    BP 110/60   Pulse 82   Ht 5\' 6"  (1.676 m)   Wt 238 lb (108 kg)   LMP 05/05/2022 (Approximate)   SpO2 98%   BMI 38.41 kg/m   Wt Readings from Last 3 Encounters:  06/04/22 238 lb (108 kg)  05/27/22 230 lb (104.3 kg)  12/25/21 250 lb (113.4 kg)    Physical Exam Vitals and nursing note reviewed.  Constitutional:      General: She is not in acute distress.    Appearance: Normal appearance. She is well-developed. She is not diaphoretic.     Comments: Well-appearing, comfortable, cooperative  HENT:     Head: Normocephalic and atraumatic.  Eyes:     General:        Right eye: No discharge.        Left eye: No discharge.     Conjunctiva/sclera: Conjunctivae normal.  Cardiovascular:     Rate and Rhythm: Normal rate.  Pulmonary:     Effort: Pulmonary effort is normal.  Skin:    General: Skin is warm and dry.     Findings: Lesion (  scalp psoriasis healing now) and rash (bilateral inner thigh and R under arm, with moderate sized area of convergence rash of maculopapular erythematous rash. Blanching. No infection or complication. Not psoriasis) present. No erythema.  Neurological:     Mental Status: She is alert and oriented to person, place, and time.  Psychiatric:        Mood and Affect: Mood normal.        Behavior: Behavior normal.        Thought Content: Thought content normal.     Comments: Well groomed, good eye contact, normal speech and thoughts       Results for orders placed or performed in visit on 12/12/21  Urine Culture   Specimen: Urine  Result Value Ref Range   MICRO NUMBER: [id number]    SPECIMEN QUALITY: Adequate    Sample Source URINE    STATUS: FINAL    ISOLATE 1: Escherichia coli (A)       Susceptibility   Escherichia coli - URINE CULTURE, REFLEX    AMOX/CLAVULANIC  <=2 Sensitive     AMPICILLIN <=2 Sensitive     AMPICILLIN/SULBACTAM <=2 Sensitive     CEFAZOLIN* <=4 Not Reportable      * For infections other than uncomplicated UTI caused by E. coli, K. pneumoniae or P. mirabilis: Cefazolin is resistant if MIC > or = 8 mcg/mL. (Distinguishing susceptible versus intermediate for isolates with MIC < or = 4 mcg/mL requires additional testing.) For uncomplicated UTI caused by E. coli, K. pneumoniae or P. mirabilis: Cefazolin is susceptible if MIC <32 mcg/mL and predicts susceptible to the oral agents cefaclor, cefdinir, cefpodoxime, cefprozil, cefuroxime, cephalexin and loracarbef.     CEFTAZIDIME <=1 Sensitive     CEFEPIME <=1 Sensitive     CEFTRIAXONE <=1 Sensitive     CIPROFLOXACIN <=0.25 Sensitive     LEVOFLOXACIN <=0.12 Sensitive     GENTAMICIN <=1 Sensitive     IMIPENEM <=0.25 Sensitive     NITROFURANTOIN <=16 Sensitive     PIP/TAZO <=4 Sensitive     TOBRAMYCIN <=1 Sensitive     TRIMETH/SULFA* <=20 Sensitive      * For infections other than uncomplicated UTI caused by E. coli, K. pneumoniae or P. mirabilis: Cefazolin is resistant if MIC > or = 8 mcg/mL. (Distinguishing susceptible versus intermediate for isolates with MIC < or = 4 mcg/mL requires additional testing.) For uncomplicated UTI caused by E. coli, K. pneumoniae or P. mirabilis: Cefazolin is susceptible if MIC <32 mcg/mL and predicts susceptible to the oral agents cefaclor, cefdinir, cefpodoxime, cefprozil, cefuroxime, cephalexin and loracarbef. Legend: S = Susceptible  I = Intermediate R = Resistant  NS = Not susceptible * = Not tested  NR = Not reported **NN = See antimicrobic comments   POCT urinalysis dipstick  Result Value Ref Range   Color, UA     Clarity, UA     Glucose, UA Negative Negative   Bilirubin, UA Negative    Ketones, UA Negative    Spec Grav, UA 1.015 1.010 - 1.025   Blood, UA Large    pH, UA 6.0 5.0 - 8.0   Protein, UA Positive (A) Negative    Urobilinogen, UA 0.2 0.2 or 1.0 E.U./dL   Nitrite, UA Postive    Leukocytes, UA Large (3+) (A) Negative   Appearance     Odor        Assessment & Plan:   Problem List Items Addressed This Visit   None Visit Diagnoses  Psoriasis of scalp    -  Primary   Relevant Orders   Ambulatory referral to Dermatology   Atopic dermatitis, unspecified type       Relevant Orders   Ambulatory referral to Dermatology      Scalp Psoriasis Triggered by hair dye w flare Improved now s/p steroids and on topical clobetasol Offer foam or shampoo options for future  Atopic Dermatitis various patches vs Medication side effect rash possible (Keflex?)  Use Clobetasol topical. Already done with Keflex. Avoid triggers to flare.   referral to Dermatology for management of scalp psoriasis, currently also experiencing non psoriatic rash, appears patches of atopic dermatitis various locations.   Orders Placed This Encounter  Procedures   Ambulatory referral to Dermatology    Referral Priority:   Routine    Referral Type:   Consultation    Referral Reason:   Specialty Services Required    Requested Specialty:   Dermatology    Number of Visits Requested:   1     No orders of the defined types were placed in this encounter.     Follow up plan: Return if symptoms worsen or fail to improve.   Saralyn Pilar, DO [facility] 06/04/2022, 10:41 AM

## 2022-06-05 ENCOUNTER — Telehealth: Payer: Self-pay | Admitting: Family Medicine

## 2022-06-05 NOTE — Telephone Encounter (Signed)
FYI It looks like there was a delay in [facility] receiving the referral?  Would you be able to follow up on this Monday 06/08/22 or later?  Thank you!  Saralyn Pilar, DO [facility] 06/05/2022, 4:48 PM

## 2022-06-05 NOTE — Telephone Encounter (Signed)
Pt called in says, [facility] [place] [facility] hasnt received the referral. I let her know, referral sent 04/25. Please resend referral if need be.

## 2022-06-15 ENCOUNTER — Ambulatory Visit: Payer: Medicaid Other

## 2022-06-23 ENCOUNTER — Other Ambulatory Visit: Payer: Self-pay | Admitting: Family Medicine

## 2022-06-23 NOTE — Telephone Encounter (Signed)
Can you contact patient back to gather a bit more information?  I would like to clarify - since this was ordered back in October 2023 I believe.  I don't see any recent order.  Does she still have the medicine, or does she need a new order?  Caution with this med is elevation in blood pressure and cardiovascular concerns.  She should follow-up with me in 30 days after taking it to determine if we can continue it.  Let me know if need new order.  Thanks  Saralyn Pilar, DO [facility] 06/23/2022, 1:59 PM

## 2022-06-23 NOTE — Telephone Encounter (Signed)
Pt is starting weight loss medication in a week, she wanted to share that with her PCP     Medication Refill - Medication: phentermine 37.5 MG capsule   Has the patient contacted their pharmacy? Yes.   (Agent: If no, request that the patient contact the pharmacy for the refill. If patient does not wish to contact the pharmacy document the reason why and proceed with request.) (Agent: If yes, when and what did the pharmacy advise?)  Preferred Pharmacy (with phone number or street name):  [facility] [place], [place] - [address]  [address] [place] [address]  Phone: [phone] Fax: [phone]   Has the patient been seen for an appointment in the last year OR does the patient have an upcoming appointment? Yes.    Agent: Please be advised that RX refills may take up to 3 business days. We ask that you follow-up with your pharmacy.

## 2022-06-24 MED ORDER — PHENTERMINE HCL 37.5 MG PO CAPS
37.5000 mg | ORAL_CAPSULE | ORAL | 0 refills | Status: DC
Start: 2022-06-24 — End: 2022-07-24

## 2022-06-24 NOTE — Addendum Note (Signed)
Addended by: Judd Gaudier on: 06/24/2022 09:08 AM   Modules accepted: Orders

## 2022-06-24 NOTE — Telephone Encounter (Signed)
Patient would like to restart phentermine.  Denies side effects when used previously.  Appointment scheduled.

## 2022-06-29 ENCOUNTER — Ambulatory Visit: Payer: Self-pay

## 2022-06-29 ENCOUNTER — Ambulatory Visit: Payer: Medicaid Other | Admitting: Internal Medicine

## 2022-06-29 ENCOUNTER — Encounter: Payer: Self-pay | Admitting: Internal Medicine

## 2022-06-29 VITALS — BP 106/68 | HR 92 | Temp 97.2°F | Wt 237.0 lb

## 2022-06-29 DIAGNOSIS — J069 Acute upper respiratory infection, unspecified: Secondary | ICD-10-CM

## 2022-06-29 MED ORDER — BENZONATATE 200 MG PO CAPS: 200.0000 mg | ORAL_CAPSULE | Freq: Three times a day (TID) | ORAL | 0 refills | Status: DC | PRN

## 2022-06-29 MED ORDER — ALBUTEROL SULFATE HFA 108 (90 BASE) MCG/ACT IN AERS: 1.0000 | INHALATION_SPRAY | Freq: Four times a day (QID) | RESPIRATORY_TRACT | 0 refills | Status: DC | PRN

## 2022-06-29 NOTE — Telephone Encounter (Signed)
  Chief Complaint: all my chest" hurts with coughing and taking deep breath Symptoms: head congestion, cough (dry), SOB with activity, back pain with coughing Frequency: yesterday Pertinent Negatives: Patient denies dizziness, nausea, vomiting, sweating fever, radiating pains Disposition: [] ED /[] Urgent Care (no appt availability in office) / [x] Appointment(In office/virtual)/ []  [facility]/ [] Home Care/ [] Refused Recommended Disposition /[] [facility]/ []  Follow-up with PCP Additional Notes: n/a Reason for Disposition  [1] Chest pain(s) lasting a few seconds from coughing AND [2] persists > 3 days  Answer Assessment - Initial Assessment Questions 1. LOCATION: "Where does it hurt?"       Deep breath in hurts all over chest  2. RADIATION: "Does the pain go anywhere else?" (e.g., into neck, jaw, arms, back)     Back with coughing 3. ONSET: "When did the chest pain begin?" (Minutes, hours or days)      Yesterday  4. PATTERN: "Does the pain come and go, or has it been constant since it started?"  "Does it get worse with exertion?"      Comes with cough  5. DURATION: "How long does it last" (e.g., seconds, minutes, hours)     2 minutes 6. SEVERITY: "How bad is the pain?"  (e.g., Scale 1-10; mild, moderate, or severe)    - MILD (1-3): doesn't interfere with normal activities     - MODERATE (4-7): interferes with normal activities or awakens from sleep    - SEVERE (8-10): excruciating pain, unable to do any normal activities       moderate 7. CARDIAC RISK FACTORS: "Do you have any history of heart problems or risk factors for heart disease?" (e.g., angina, prior heart attack; diabetes, high blood pressure, high cholesterol, smoker, or strong family history of heart disease)     no 8. PULMONARY RISK FACTORS: "Do you have any history of lung disease?"  (e.g., blood clots in lung, asthma, emphysema, birth control pills)     no 9. CAUSE: "What do you think is causing the  chest pain?"     unsure 10. OTHER SYMPTOMS: "Do you have any other symptoms?" (e.g., dizziness, nausea, vomiting, sweating, fever, difficulty breathing, cough)       Head congestion cough , SOB with activity  11. PREGNANCY: "Is there any chance you are pregnant?" "When was your last menstrual period?"       N/a  Protocols used: Chest Pain-A-AH

## 2022-06-29 NOTE — Patient Instructions (Signed)

## 2022-06-29 NOTE — Progress Notes (Signed)
Subjective:    Patient ID: Jennifer Bass, female    DOB: [birth year], 22 y.o.   MRN: [id number]  HPI  Patient presents to clinic today with complaint of runny nose, nasal congestion, sore throat and cough.  This started yesterday. She was having some difficulty swallowing but this has improved today.  The cough is productive at times of green mucous.  She reports associated chest tightness but only when coughing.  She denies headache, ear pain, chest pain, nausea, vomiting or diarrhea. She denies fever, chills or body aches. She has tried [employer] and Flu with some relief of symptoms. She has seasonal allergies but no history of asthma. She does vape. She has not had sick contacts that she is aware of.   Review of Systems     No past medical history on file.  Current Outpatient Medications  Medication Sig Dispense Refill   clobetasol cream (TEMOVATE) 0.05 % Apply 1 Application topically 2 (two) times daily. 30 g 0   hydrOXYzine (ATARAX) 25 MG tablet Take 1 tablet (25 mg total) by mouth every 6 (six) hours. 24 tablet 0   phentermine 37.5 MG capsule Take 1 capsule (37.5 mg total) by mouth every morning. 30 capsule 0   No current facility-administered medications for this visit.    Allergies  Allergen Reactions   Prednisone Swelling   Hydrocodone Rash    Family History  Problem Relation Age of Onset   Colon cancer Paternal Grandfather        30s   Breast cancer Other        30s   Breast cancer Maternal Great-grandmother        not sure of age    Social History   Socioeconomic History   Marital status: Single    Spouse name: Not on file   Number of children: Not on file   Years of education: Not on file   Highest education level: Not on file  Occupational History   Not on file  Tobacco Use   Smoking status: Never   Smokeless tobacco: Never  Vaping Use   Vaping Use: Every day   Substances: Nicotine, Flavoring  Substance and Sexual Activity   Alcohol use:  Yes    Comment: occasionally    Drug use: Not Currently    Types: Marijuana   Sexual activity: Yes    Birth control/protection: None  Other Topics Concern   Not on file  Social History Narrative   Not on file   Social Determinants of Health   Financial Resource Strain: Not on file  Food Insecurity: Not on file  Transportation Needs: Not on file  Physical Activity: Not on file  Stress: Not on file  Social Connections: Not on file  Intimate Partner Violence: Not on file     Constitutional: Denies fever, malaise, fatigue, headache or abrupt weight changes.  HEENT: Pt reports runny nose, nasal congestion and sore throat. Denies eye pain, eye redness, ear pain, ringing in the ears, wax buildup, bloody nose. Respiratory: Pt reports cough. Denies difficulty breathing, shortness of breath.   Cardiovascular: Pt reports chest tightness only when coughing. Denies chest pain, palpitations or swelling in the hands or feet.  Gastrointestinal: Denies abdominal pain, bloating, constipation, diarrhea or blood in the stool.  Neurological: Denies dizziness, difficulty with memory, difficulty with speech or problems with balance and coordination.    No other specific complaints in a complete review of systems (except as listed in HPI above).  Objective:   Physical Exam  BP 106/68 (BP Location: Left Arm, Patient Position: Sitting, Cuff Size: Large)   Pulse 92   Temp (!) 97.2 F (36.2 C) (Temporal)   Wt 237 lb (107.5 kg)   SpO2 96%   BMI 38.25 kg/m   Wt Readings from Last 3 Encounters:  06/04/22 238 lb (108 kg)  05/27/22 230 lb (104.3 kg)  12/25/21 250 lb (113.4 kg)    General: Appears her stated age, obese, in NAD. Skin: Warm, dry and intact. No rashes noted. HEENT: Head: normal shape and size, no sinus tenderness noted; Eyes: sclera white, no icterus, conjunctiva pink, PERRLA and EOMs intact; Ears: Tm's gray and intact, normal light reflex, eczema noted of bilateral external ear  canals; Nose: mucosa pink and moist, septum midline; Throat/Mouth: Teeth present, mucosa erythematous and moist, + PND, no exudate, lesions or ulcerations noted.  Neck:  No adenopathy noted. Cardiovascular: Normal rate and rhythm. S1,S2 noted.  No murmur, rubs or gallops noted.  Pulmonary/Chest: Normal effort and positive vesicular breath sounds. No respiratory distress. No wheezes, rales or ronchi noted.  Neurological: Alert and oriented.    BMET    Component Value Date/Time   NA 139 07/17/2015 1400   K 4.2 07/17/2015 1400   CL 109 07/17/2015 1400   CO2 22 07/17/2015 1400   GLUCOSE 94 07/17/2015 1400   BUN 8 07/17/2015 1400   CREATININE 0.84 07/17/2015 1400   CALCIUM 9.6 07/17/2015 1400   GFRNONAA NOT CALCULATED 07/17/2015 1400   GFRAA NOT CALCULATED 07/17/2015 1400    Lipid Panel  No results found for: "CHOL", "TRIG", "HDL", "CHOLHDL", "VLDL", "LDLCALC"  CBC    Component Value Date/Time   WBC 9.6 07/17/2015 1400   RBC 5.13 07/17/2015 1400   HGB 13.4 07/17/2015 1400   HCT 40.8 07/17/2015 1400   PLT 297 07/17/2015 1400   MCV 79.4 (L) 07/17/2015 1400   MCH 26.2 07/17/2015 1400   MCHC 33.0 07/17/2015 1400   RDW 15.4 (H) 07/17/2015 1400    Hgb A1C Lab Results  Component Value Date   HGBA1C 5.1 01/13/2021          Assessment & Plan:   Viral URI with Cough:  No indication for flu/covid testing at this time as she does not appear acutely ill Possible allergy related, advised her to start Zyrtec and Flonase OTC x 7-10 days RX for Tessalon 200 mg TID prn RX for Albuterol 1-2 puffs Q4-6 hours as needed No indication for antibiotics at this time- likely viral/allergy related She reports she had a steroid injection 3 weeks ago for allergic reaction so would avoid steroid shot at this time  Follow up with your PCP as previously scheduled Nicki Reaper, NP

## 2022-07-24 ENCOUNTER — Encounter: Payer: Self-pay | Admitting: Family Medicine

## 2022-07-24 ENCOUNTER — Ambulatory Visit: Payer: Medicaid Other | Admitting: Family Medicine

## 2022-07-24 DIAGNOSIS — Z6841 Body Mass Index (BMI) 40.0 and over, adult: Secondary | ICD-10-CM

## 2022-07-24 MED ORDER — PHENTERMINE HCL 37.5 MG PO CAPS: 37.5000 mg | ORAL_CAPSULE | ORAL | 0 refills | Status: DC

## 2022-07-24 MED ORDER — PHENTERMINE HCL 37.5 MG PO CAPS
37.5000 mg | ORAL_CAPSULE | ORAL | 0 refills | Status: DC
Start: 2022-07-27 — End: 2022-12-10

## 2022-07-24 NOTE — Progress Notes (Signed)
Subjective:    Patient ID: Jennifer Bass, female    DOB: 09/25/2000, 22 y.o.   MRN: [id number]  Jennifer Bass is a 22 y.o. female presenting on 07/24/2022 for Obesity   HPI  Morbid Obesity BMI >37 Past history of abnormal weight gain over course of past 1+ year, Wt up from 170 lbs (05/2020) to 240+ lbs (01/2021). Peak weight 250 lbs previously Failed diet exercise lifestyle modifications Tried Wellbutrin previously  Additional PMH - 1 prior pregnancy. She had 1 son, born 2018. But did not gain much at that time. She lost a lot of the pregnancy weight at that time.  Family history has some patients with obesity, but not all family members.  She has done well with Phentermine without side effects. She has had weight loss. Recently within past 30 days has started [employer] through 3rd party. She has purchased this and taken with good results, Semaglutide + B6 Lost 10 lbs in past 30 days       07/24/2022    3:17 PM 11/13/2021   11:23 AM 03/31/2021    3:38 PM  Depression screen PHQ 2/9  Decreased Interest 0 0 0  Down, Depressed, Hopeless 0 0 0  PHQ - 2 Score 0 0 0  Altered sleeping 0 1 0  Tired, decreased energy 0 1 1  Change in appetite 0 1 1  Feeling bad or failure about yourself  0 0 0  Trouble concentrating 0 0 0  Moving slowly or fidgety/restless 0 0 0  Suicidal thoughts 0 0 0  PHQ-9 Score 0 3 2  Difficult doing work/chores Not difficult at all Not difficult at all Not difficult at all    Social History   Tobacco Use   Smoking status: Never   Smokeless tobacco: Never  Vaping Use   Vaping Use: Every day   Substances: Nicotine, Flavoring  Substance Use Topics   Alcohol use: Yes    Comment: occasionally    Drug use: Not Currently    Types: Marijuana    Review of Systems Per HPI unless specifically indicated above     Objective:    BP 108/70 (BP Location: Left Arm, Patient Position: Sitting, Cuff Size: Normal)   Pulse 76   Temp (!) 96.8 F (36 C)  (Temporal)   Wt 231 lb (104.8 kg)   SpO2 97%   BMI 37.28 kg/m   Wt Readings from Last 3 Encounters:  07/24/22 231 lb (104.8 kg)  06/29/22 237 lb (107.5 kg)  06/04/22 238 lb (108 kg)    Physical Exam Vitals and nursing note reviewed.  Constitutional:      General: She is not in acute distress.    Appearance: Normal appearance. She is well-developed. She is obese. She is not diaphoretic.     Comments: Well-appearing, comfortable, cooperative  HENT:     Head: Normocephalic and atraumatic.  Eyes:     General:        Right eye: No discharge.        Left eye: No discharge.     Conjunctiva/sclera: Conjunctivae normal.  Cardiovascular:     Rate and Rhythm: Normal rate.  Pulmonary:     Effort: Pulmonary effort is normal.  Skin:    General: Skin is warm and dry.     Findings: No erythema or rash.  Neurological:     Mental Status: She is alert and oriented to person, place, and time.  Psychiatric:  Mood and Affect: Mood normal.        Behavior: Behavior normal.        Thought Content: Thought content normal.     Comments: Well groomed, good eye contact, normal speech and thoughts       Results for orders placed or performed in visit on 12/12/21  Urine Culture   Specimen: Urine  Result Value Ref Range   MICRO NUMBER: [id number]    SPECIMEN QUALITY: Adequate    Sample Source URINE    STATUS: FINAL    ISOLATE 1: Escherichia coli (A)       Susceptibility   Escherichia coli - URINE CULTURE, REFLEX    AMOX/CLAVULANIC <=2 Sensitive     AMPICILLIN <=2 Sensitive     AMPICILLIN/SULBACTAM <=2 Sensitive     CEFAZOLIN* <=4 Not Reportable      * For infections other than uncomplicated UTI caused by E. coli, K. pneumoniae or P. mirabilis: Cefazolin is resistant if MIC > or = 8 mcg/mL. (Distinguishing susceptible versus intermediate for isolates with MIC < or = 4 mcg/mL requires additional testing.) For uncomplicated UTI caused by E. coli, K. pneumoniae or P. mirabilis:  Cefazolin is susceptible if MIC <32 mcg/mL and predicts susceptible to the oral agents cefaclor, cefdinir, cefpodoxime, cefprozil, cefuroxime, cephalexin and loracarbef.     CEFTAZIDIME <=1 Sensitive     CEFEPIME <=1 Sensitive     CEFTRIAXONE <=1 Sensitive     CIPROFLOXACIN <=0.25 Sensitive     LEVOFLOXACIN <=0.12 Sensitive     GENTAMICIN <=1 Sensitive     IMIPENEM <=0.25 Sensitive     NITROFURANTOIN <=16 Sensitive     PIP/TAZO <=4 Sensitive     TOBRAMYCIN <=1 Sensitive     TRIMETH/SULFA* <=20 Sensitive      * For infections other than uncomplicated UTI caused by E. coli, K. pneumoniae or P. mirabilis: Cefazolin is resistant if MIC > or = 8 mcg/mL. (Distinguishing susceptible versus intermediate for isolates with MIC < or = 4 mcg/mL requires additional testing.) For uncomplicated UTI caused by E. coli, K. pneumoniae or P. mirabilis: Cefazolin is susceptible if MIC <32 mcg/mL and predicts susceptible to the oral agents cefaclor, cefdinir, cefpodoxime, cefprozil, cefuroxime, cephalexin and loracarbef. Legend: S = Susceptible  I = Intermediate R = Resistant  NS = Not susceptible * = Not tested  NR = Not reported **NN = See antimicrobic comments   POCT urinalysis dipstick  Result Value Ref Range   Color, UA     Clarity, UA     Glucose, UA Negative Negative   Bilirubin, UA Negative    Ketones, UA Negative    Spec Grav, UA 1.015 1.010 - 1.025   Blood, UA Large    pH, UA 6.0 5.0 - 8.0   Protein, UA Positive (A) Negative   Urobilinogen, UA 0.2 0.2 or 1.0 E.U./dL   Nitrite, UA Postive    Leukocytes, UA Large (3+) (A) Negative   Appearance     Odor        Assessment & Plan:   Problem List Items Addressed This Visit   None Visit Diagnoses     Morbid obesity with BMI of 40.0-44.9, adult (HCC)       Relevant Medications   phentermine 37.5 MG capsule (Start on 08/24/2022)   phentermine 37.5 MG capsule (Start on 07/27/2022)       Morbid Obesity IMproving wt loss with  medications and lifestyle Now on Semaglutide Discussed goal to discontinue Phentermine soon, short  term only, will overlap until at 1mg  weekly semaglutide  She has no side effects on Phentermine. No having elevated BP HR or chest pain.  30 day phentermine re ordered, prefer to scale back to semaglutide only once you reach the 1mg , maybe can wait 30 days on the 1mg . Sent 2 refills on the Phentermine to [facility] Future dates  6/17, 7/15   Meds ordered this encounter  Medications   phentermine 37.5 MG capsule    Sig: Take 1 capsule (37.5 mg total) by mouth every morning.    Dispense:  30 capsule    Refill:  0    First fill 08/24/22   phentermine 37.5 MG capsule    Sig: Take 1 capsule (37.5 mg total) by mouth every morning.    Dispense:  30 capsule    Refill:  0    First fill 07/27/22      Follow up plan: Return in about 6 months (around 01/23/2023), or if symptoms worsen or fail to improve, for 6 month follow-up.   Saralyn Pilar, DO [facility] 07/24/2022, 3:26 PM

## 2022-07-24 NOTE — Patient Instructions (Addendum)
Thank you for coming to the office today.  30 day phentermine re ordered, prefer to scale back to semaglutide only once you reach the 1mg , maybe can wait 30 days on the 1mg .  Sent 2 refills on the Phentermine to [facility]  Future dates   6/17 7/15  -----------  Alternative options  Semaglutide injection (mixed Ozempic) from [facility] 0.25mg  weekly for 4 weeks then increase to 0.5mg  weekly It comes in a vial and a needle syringe, you need to draw up the shot and self admin it weekly Cost is about $200 per month Call them to check pricing and availability  [facility] Address: [address] [place], [place], [place] [address] Phone: [phone]  --------------------------------------------------  Please schedule a Follow-up Appointment to: Return in about 6 months (around 01/23/2023), or if symptoms worsen or fail to improve, for 6 month follow-up.  If you have any other questions or concerns, please feel free to call the office or send a message through MyChart. You may also schedule an earlier appointment if necessary.  Additionally, you may be receiving a survey about your experience at our office within a few days to 1 week by e-mail or mail. We value your feedback.  Saralyn Pilar, DO [facility], [place]

## 2022-09-05 IMAGING — US US SOFT TISSUE HEAD/NECK
1 series · 6 of 6 positions shown · non-contrast
Comparison: None.

CLINICAL DATA: One year history of isolated left posterior cervical
lymphadenopathy

EXAM:
ULTRASOUND OF HEAD/NECK SOFT TISSUES
TECHNIQUE: Ultrasound examination of the head and neck soft tissues was
performed in the area of clinical concern.

[Series 1: us soft tissue head & neck (non-thyroid) · 6 of 6 slices shown]
[im 1/6]
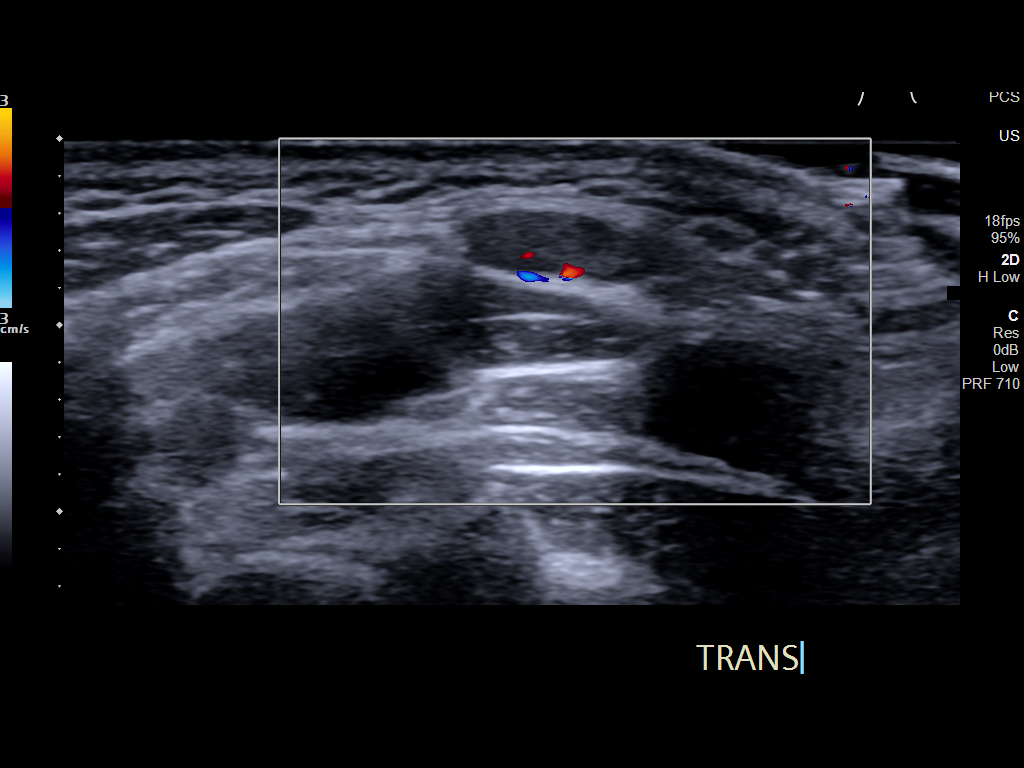
[im 2/6]
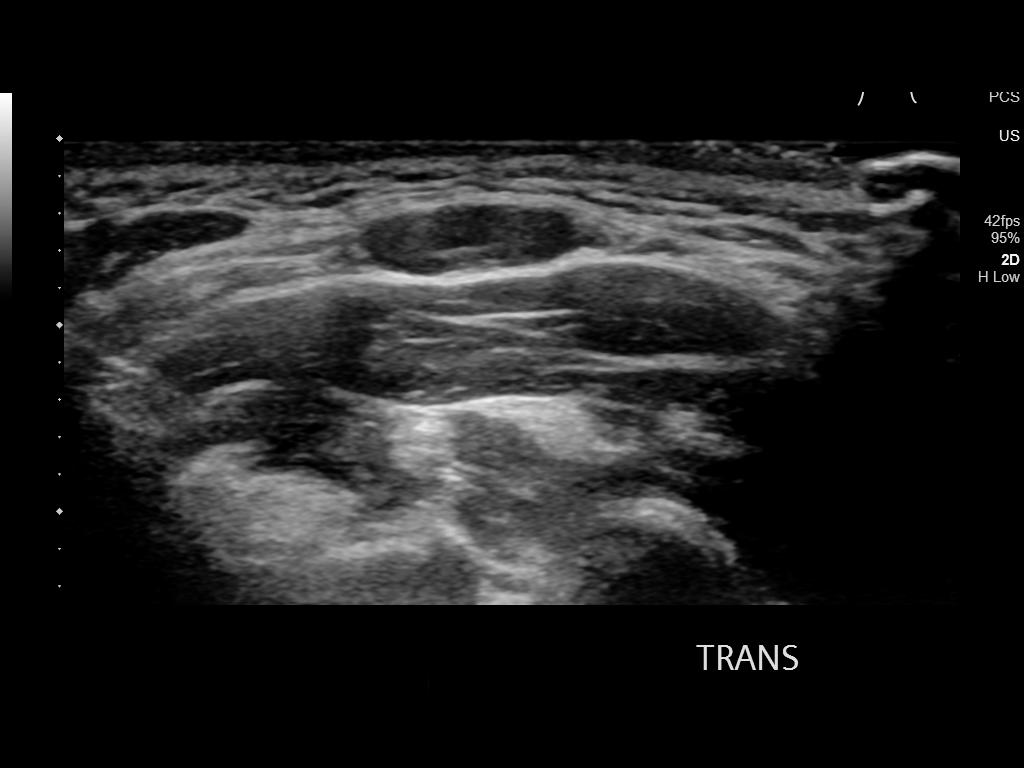
[im 3/6]
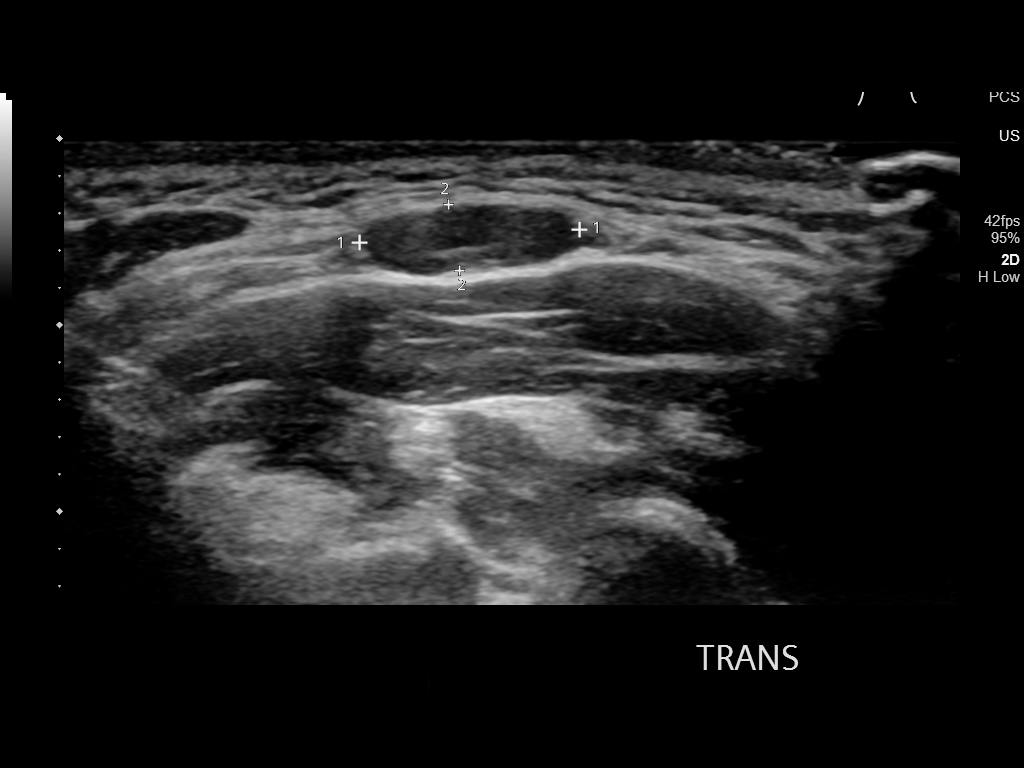
[im 4/6]
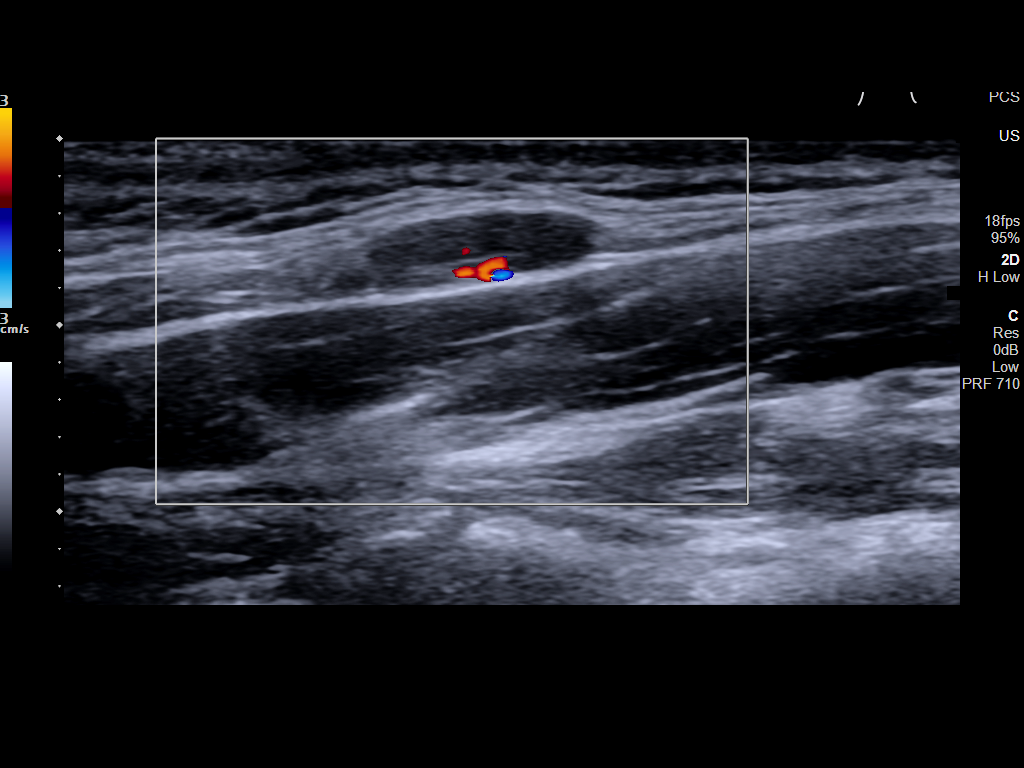
[im 5/6]
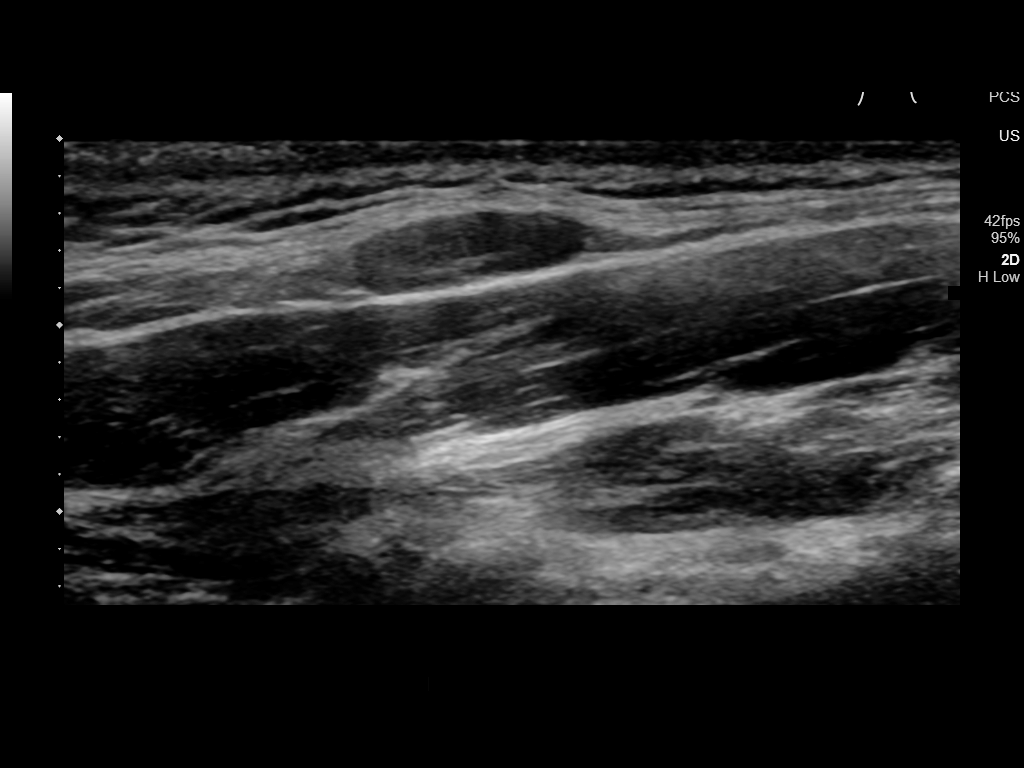
[im 6/6]
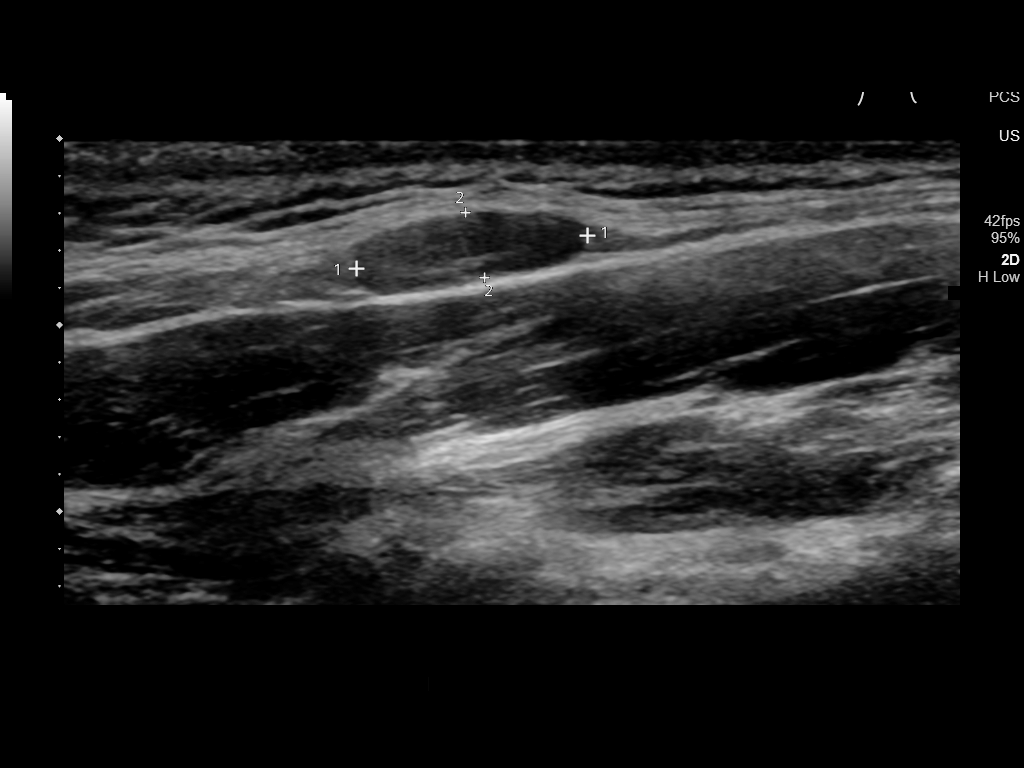

[6 of 6 positions shown; findings below may reference images not displayed]

FINDINGS: Sonographic interrogation of the region of clinical concern
demonstrates an ovoid hypoechoic soft tissue nodule with an
echogenic fatty hilum and vascular pedicle in the left
posterolateral neck. Sonographically, the structure is most
consistent with a lymph node and measures 1.2 x 0.4 x 1.3 cm. 0.4 cm
in short axis is not considered enlarged by imaging criteria.
IMPRESSION: Mildly prominent but not enlarged left posterolateral superficial
lymph node. This is almost certainly reactive in nature. Recommend
continued clinical surveillance. If there is evidence of growth over
time, further imaging may become warranted.

## 2022-11-10 ENCOUNTER — Ambulatory Visit
Admission: EM | Admit: 2022-11-10 | Discharge: 2022-11-10 | Disposition: A | Discharge status: Home / Self Care | Payer: Medicaid Other | Attending: Emergency Medicine | Admitting: Emergency Medicine

## 2022-11-10 DIAGNOSIS — T7840XA Allergy, unspecified, initial encounter: Secondary | ICD-10-CM | POA: Diagnosis not present

## 2022-11-10 MED ORDER — TRIAMCINOLONE ACETONIDE 0.5 % EX OINT: 1.0000 | TOPICAL_OINTMENT | Freq: Two times a day (BID) | CUTANEOUS | 0 refills | Status: DC

## 2022-11-10 NOTE — ED Triage Notes (Signed)
Allergic reaction to hair dye x 1 day. Rash is on right sigh of neck and back of neck.

## 2022-11-10 NOTE — ED Provider Notes (Signed)
[facility]    CSN: [id number] Arrival date & time: 11/10/22  4098      History   Chief Complaint Chief Complaint  Patient presents with   Allergic Reaction    HPI Jennifer Bass is a 22 y.o. female.   HPI  22 year old female with no significant past medical history presents for evaluation of rash to bilateral ears and neck after having her hair dyed.  She reports that she has had a similar reaction to hair dyes in the past.  She has been treated with IM Decadron and oral prednisone which caused significant swelling of her hands and feet.  She states that she can use topical creams without difficulty.  She denies any respiratory symptoms.  History reviewed. No pertinent past medical history.  There are no problems to display for this patient.   Past Surgical History:  Procedure Laterality Date   CESAREAN SECTION  09/2016   WISDOM TOOTH EXTRACTION      OB History     Gravida  1   Para  1   Term  1   Preterm      AB      Living  1      SAB      IAB      Ectopic      Multiple      Live Births  1            Home Medications    Prior to Admission medications   Medication Sig Start Date End Date Taking? Authorizing Provider  triamcinolone ointment (KENALOG) 0.5 % Apply 1 Application topically 2 (two) times daily. 11/10/22  Yes Becky Augusta, NP  phentermine 37.5 MG capsule Take 1 capsule (37.5 mg total) by mouth every morning. 08/24/22   Karamalegos, Netta Neat, DO  phentermine 37.5 MG capsule Take 1 capsule (37.5 mg total) by mouth every morning. 07/27/22   Karamalegos, Netta Neat, DO  etonogestrel (NEXPLANON) 68 MG IMPL implant 1 each by Subdermal route once.  02/25/19  [provider]    Family History Family History  Problem Relation Age of Onset   Colon cancer Paternal Grandfather        30s   Breast cancer Other        30s   Breast cancer Maternal Great-grandmother        not sure of age    Social  History Social History   Tobacco Use   Smoking status: Never   Smokeless tobacco: Never  Vaping Use   Vaping status: Every Day   Substances: Nicotine, Flavoring  Substance Use Topics   Alcohol use: Yes    Comment: occasionally    Drug use: Not Currently    Types: Marijuana     Allergies   Prednisone and Hydrocodone   Review of Systems Review of Systems  Constitutional:  Negative for fever.  Respiratory:  Negative for shortness of breath.   Skin:  Positive for color change and rash.     Physical Exam Triage Vital Signs ED Triage Vitals  Encounter Vitals Group     BP      Systolic BP Percentile      Diastolic BP Percentile      Pulse      Resp      Temp      Temp src      SpO2      Weight      Height      Head  Circumference      Peak Flow      Pain Score      Pain Loc      Pain Education      Exclude from Growth Chart    No data found.  Updated Vital Signs BP 118/66 (BP Location: Right Arm)   Pulse 64   Temp 98.1 F (36.7 C) (Oral)   Resp 17   Wt 220 lb (99.8 kg)   SpO2 97%   BMI 35.51 kg/m   Visual Acuity Right Eye Distance:   Left Eye Distance:   Bilateral Distance:    Right Eye Near:   Left Eye Near:    Bilateral Near:     Physical Exam Vitals and nursing note reviewed.  Constitutional:      Appearance: Normal appearance. She is not ill-appearing.  HENT:     Head: Normocephalic and atraumatic.  Pulmonary:     Effort: Pulmonary effort is normal.  Skin:    General: Skin is warm and dry.     Capillary Refill: Capillary refill takes less than 2 seconds.     Findings: Erythema and rash present.  Neurological:     General: No focal deficit present.     Mental Status: She is alert and oriented to person, place, and time.      UC Treatments / Results  Labs (all labs ordered are listed, but only abnormal results are displayed) Labs Reviewed - No data to display  EKG   Radiology No results found.  Procedures Procedures  (including critical care time)  Medications Ordered in UC Medications - No data to display  Initial Impression / Assessment and Plan / UC Course  I have reviewed the triage vital signs and the nursing notes.  Pertinent labs & imaging results that were available during my care of the patient were reviewed by me and considered in my medical decision making (see chart for details).   Patient is a pleasant, nontoxic-appearing 22 year old female presenting for evaluation of skin rash after being exposed to hair dye.      As you can see in images above, the skin on her ears, scalp, and neck is erythematous with mild edema.  No pustules or vesicles.  It is not hot to touch.  This exam is consistent with allergic reaction to hair dye.  I will put her on triamcinolone 0.5% ointment twice daily along with double antihistamine therapy for treatment of her allergic reaction.  If her symptoms worsen she needs to return for reevaluation or see her PCP.   Final Clinical Impressions(s) / UC Diagnoses   Final diagnoses:  Allergic reaction, initial encounter     Discharge Instructions      Take over-the-counter Allegra 180 mg daily or Zyrtec or Claritin 10 mg daily to help with your itching.  You can take over-the-counter Benadryl, 50 mg at bedtime, as needed for itching and sleep.  Apply the triamcinolone ointment 0.5% twice daily to the rash on your ears and neck.  Apply until the rash has resolved.  Take over-the-counter Pepcid 20 mg twice daily to help with itching as well.  If you develop any swelling of your lips or tongue, tightness in your throat, or difficulty breathing you need to go to the ER for evaluation.      ED Prescriptions     Medication Sig Dispense Auth. Provider   triamcinolone ointment (KENALOG) 0.5 % Apply 1 Application topically 2 (two) times daily. 30 g Becky Augusta, NP  PDMP not reviewed this encounter.   Becky Augusta, NP 11/10/22 [phone]

## 2022-11-10 NOTE — Discharge Instructions (Signed)
Take over-the-counter Allegra 180 mg daily or Zyrtec or Claritin 10 mg daily to help with your itching.  You can take over-the-counter Benadryl, 50 mg at bedtime, as needed for itching and sleep.  Apply the triamcinolone ointment 0.5% twice daily to the rash on your ears and neck.  Apply until the rash has resolved.  Take over-the-counter Pepcid 20 mg twice daily to help with itching as well.  If you develop any swelling of your lips or tongue, tightness in your throat, or difficulty breathing you need to go to the ER for evaluation.

## 2022-12-10 ENCOUNTER — Ambulatory Visit: Payer: Medicaid Other | Admitting: Internal Medicine

## 2022-12-10 ENCOUNTER — Encounter: Payer: Self-pay | Admitting: Internal Medicine

## 2022-12-10 VITALS — BP 120/82 | HR 69 | Ht 66.0 in | Wt 227.0 lb

## 2022-12-10 DIAGNOSIS — H6992 Unspecified Eustachian tube disorder, left ear: Secondary | ICD-10-CM | POA: Diagnosis not present

## 2022-12-10 MED ORDER — METHYLPREDNISOLONE ACETATE 80 MG/ML IJ SUSP
80.0000 mg | Freq: Once | INTRAMUSCULAR | Status: AC
Start: 2022-12-10 — End: 2022-12-10
  Administered 2022-12-10: 80 mg via INTRAMUSCULAR

## 2022-12-10 NOTE — Progress Notes (Signed)
Subjective:    Patient ID: Jennifer Bass, female    DOB: 2001-02-05, 22 y.o.   MRN: [id number]  HPI  Discussed the use of AI scribe software for clinical note transcription with the patient, who gave verbal consent to proceed.  The patient presented with a two-day history of left ear pain and sore throat. The pain was described as radiating from the throat to the ear, particularly noticeable when swallowing. The intensity of the sore throat had decreased from the previous day when it was more constant. The patient denied any associated symptoms such as headache, runny nose, nasal congestion, cough, shortness of breath, nausea, vomiting, diarrhea, fever, chills, or body aches. There was no known exposure to sick contacts.  The patient had a history of cleaning her ears regularly and was concerned about the possibility of having introduced an irritant. She also acknowledged a history of occasional allergies during this time of the year. Over-the-counter Tylenol had been taken to manage the ear pain. The patient denied any changes in medication and reported no new medications.     Review of Systems     No past medical history on file.  Current Outpatient Medications  Medication Sig Dispense Refill   phentermine 37.5 MG capsule Take 1 capsule (37.5 mg total) by mouth every morning. 30 capsule 0   phentermine 37.5 MG capsule Take 1 capsule (37.5 mg total) by mouth every morning. 30 capsule 0   triamcinolone ointment (KENALOG) 0.5 % Apply 1 Application topically 2 (two) times daily. 30 g 0   No current facility-administered medications for this visit.    Allergies  Allergen Reactions   Prednisone Swelling   Hydrocodone Rash    Family History  Problem Relation Age of Onset   Colon cancer Paternal Grandfather        30s   Breast cancer Other        30s   Breast cancer Maternal Great-grandmother        not sure of age    Social History   Socioeconomic History   Marital status:  Single    Spouse name: Not on file   Number of children: Not on file   Years of education: Not on file   Highest education level: Not on file  Occupational History   Not on file  Tobacco Use   Smoking status: Never   Smokeless tobacco: Never  Vaping Use   Vaping status: Every Day   Substances: Nicotine, Flavoring  Substance and Sexual Activity   Alcohol use: Yes    Comment: occasionally    Drug use: Not Currently    Types: Marijuana   Sexual activity: Yes    Birth control/protection: None  Other Topics Concern   Not on file  Social History Narrative   Not on file   Social Determinants of Health   Financial Resource Strain: Not on file  Food Insecurity: Not on file  Transportation Needs: Not on file  Physical Activity: Not on file  Stress: Not on file  Social Connections: Not on file  Intimate Partner Violence: Not on file     Constitutional: Denies fever, malaise, fatigue, headache or abrupt weight changes.  HEENT: Patient reports left ear pain, sore throat.  Denies eye pain, eye redness, ringing in the ears, wax buildup, runny nose, nasal congestion, bloody nose. Respiratory: Denies difficulty breathing, shortness of breath, cough or sputum production.   Cardiovascular: Denies chest pain, chest tightness, palpitations or swelling in the hands or  feet.  Gastrointestinal: Denies abdominal pain, bloating, constipation, diarrhea or blood in the stool.  GU: Denies urgency, frequency, pain with urination, burning sensation, blood in urine, odor or discharge. Musculoskeletal: Denies decrease in range of motion, difficulty with gait, muscle pain or joint pain and swelling.  Skin: Denies redness, rashes, lesions or ulcercations.  Neurological: Denies dizziness, difficulty with memory, difficulty with speech or problems with balance and coordination.  Psych: Denies anxiety, depression, SI/HI.  No other specific complaints in a complete review of systems (except as listed in HPI  above).  Objective:   Physical Exam BP 120/82   Pulse 69   Ht 5\' 6"  (1.676 m)   Wt 227 lb (103 kg)   SpO2 96%   BMI 36.64 kg/m   Wt Readings from Last 3 Encounters:  11/10/22 220 lb (99.8 kg)  07/24/22 231 lb (104.8 kg)  06/29/22 237 lb (107.5 kg)    General: Appears her stated age, obese, in NAD. Skin: Warm, dry and intact. No rashes noted. HEENT: Head: normal shape and size, no sinus tenderness noted; Eyes: sclera white, no icterus, conjunctiva pink, PERRLA and EOMs intact; Left Ear: Tm's gray and intact, normal light reflex, + serous effusion on the left; Nose: mucosa pink and moist, septum midline; Throat/Mouth: Teeth present, mucosa pink and moist, tonsils 2+, no exudate, lesions or ulcerations noted.  Neck:  No adenopathy noted. Cardiovascular: Normal rate and rhythm. S1,S2 noted.  No murmur, rubs or gallops noted.  Pulmonary/Chest: Normal effort and positive vesicular breath sounds. No respiratory distress. No wheezes, rales or ronchi noted.  Neurological: Alert and oriented.    BMET    Component Value Date/Time   NA 139 07/17/2015 1400   K 4.2 07/17/2015 1400   CL 109 07/17/2015 1400   CO2 22 07/17/2015 1400   GLUCOSE 94 07/17/2015 1400   BUN 8 07/17/2015 1400   CREATININE 0.84 07/17/2015 1400   CALCIUM 9.6 07/17/2015 1400   GFRNONAA NOT CALCULATED 07/17/2015 1400   GFRAA NOT CALCULATED 07/17/2015 1400    Lipid Panel  No results found for: "CHOL", "TRIG", "HDL", "CHOLHDL", "VLDL", "LDLCALC"  CBC    Component Value Date/Time   WBC 9.6 07/17/2015 1400   RBC 5.13 07/17/2015 1400   HGB 13.4 07/17/2015 1400   HCT 40.8 07/17/2015 1400   PLT 297 07/17/2015 1400   MCV 79.4 (L) 07/17/2015 1400   MCH 26.2 07/17/2015 1400   MCHC 33.0 07/17/2015 1400   RDW 15.4 (H) 07/17/2015 1400    Hgb A1C Lab Results  Component Value Date   HGBA1C 5.1 01/13/2021           Assessment & Plan:   Assessment and Plan    Eustachian Tube Dysfunction Left ear pain and  sore throat for 2 days. No fever, chills, body aches, or other systemic symptoms. Clear fluid behind the tympanic membrane on otoscopic examination. Likely secondary to allergies or sinusitis. -Administer Depo Medrol 80 mg IM today to reduce inflammation. -Start Flonase, one spray twice a day for the next week. -If symptoms worsen, patient to notify the office for further evaluation and management.     Follow-up with your PCP as previously scheduled Nicki Reaper, NP

## 2022-12-10 NOTE — Patient Instructions (Signed)
Eustachian Tube Dysfunction  Eustachian tube dysfunction refers to a condition in which a blockage develops in the narrow passage that connects the middle ear to the back of the nose (eustachian tube). The eustachian tube regulates air pressure in the middle ear by letting air move between the ear and nose. It also helps to drain fluid from the middle ear space. Eustachian tube dysfunction can affect one or both ears. When the eustachian tube does not function properly, air pressure, fluid, or both can build up in the middle ear. What are the causes? This condition occurs when the eustachian tube becomes blocked or cannot open normally. Common causes of this condition include: Ear infections. Colds and other infections that affect the nose, mouth, and throat (upper respiratory tract). Allergies. Irritation from cigarette smoke. Irritation from stomach acid coming up into the esophagus (gastroesophageal reflux). The esophagus is the part of the body that moves food from the mouth to the stomach. Sudden changes in air pressure, such as from descending in an airplane or scuba diving. Abnormal growths in the nose or throat, such as: Growths that line the nose (nasal polyps). Abnormal growth of cells (tumors). Enlarged tissue at the back of the throat (adenoids). What increases the risk? You are more likely to develop this condition if: You smoke. You are overweight. You are a child who has: Certain birth defects of the mouth, such as cleft palate. Large tonsils or adenoids. What are the signs or symptoms? Common symptoms of this condition include: A feeling of fullness in the ear. Ear pain. Clicking or popping noises in the ear. Ringing in the ear (tinnitus). Hearing loss. Loss of balance. Dizziness. Symptoms may get worse when the air pressure around you changes, such as when you travel to an area of high elevation, fly on an airplane, or go scuba diving. How is this diagnosed? This  condition may be diagnosed based on: Your symptoms. A physical exam of your ears, nose, and throat. Tests, such as those that measure: The movement of your eardrum. Your hearing (audiometry). How is this treated? Treatment depends on the cause and severity of your condition. In mild cases, you may relieve your symptoms by moving air into your ears. This is called "popping the ears." In more severe cases, or if you have symptoms of fluid in your ears, treatment may include: Medicines to relieve congestion (decongestants). Medicines that treat allergies (antihistamines). Nasal sprays or ear drops that contain medicines that reduce swelling (steroids). A procedure to drain the fluid in your eardrum. In this procedure, a small tube may be placed in the eardrum to: Drain the fluid. Restore the air in the middle ear space. A procedure to insert a balloon device through the nose to inflate the opening of the eustachian tube (balloon dilation). Follow these instructions at home: Lifestyle Do not do any of the following until your health care provider approves: Travel to high altitudes. Fly in airplanes. Work in a pressurized cabin or room. Scuba dive. Do not use any products that contain nicotine or tobacco. These products include cigarettes, chewing tobacco, and vaping devices, such as e-cigarettes. If you need help quitting, ask your health care provider. Keep your ears dry. Wear fitted earplugs during showering and bathing. Dry your ears completely after. General instructions Take over-the-counter and prescription medicines only as told by your health care provider. Use techniques to help pop your ears as recommended by your health care provider. These may include: Chewing gum. Yawning. Frequent, forceful swallowing.   Closing your mouth, holding your nose closed, and gently blowing as if you are trying to blow air out of your nose. Keep all follow-up visits. This is important. Contact a  health care provider if: Your symptoms do not go away after treatment. Your symptoms come back after treatment. You are unable to pop your ears. You have: A fever. Pain in your ear. Pain in your head or neck. Fluid draining from your ear. Your hearing suddenly changes. You become very dizzy. You lose your balance. Get help right away if: You have a sudden, severe increase in any of your symptoms. Summary Eustachian tube dysfunction refers to a condition in which a blockage develops in the eustachian tube. It can be caused by ear infections, allergies, inhaled irritants, or abnormal growths in the nose or throat. Symptoms may include ear pain or fullness, hearing loss, or ringing in the ears. Mild cases are treated with techniques to unblock the ears, such as yawning or chewing gum. More severe cases are treated with medicines or procedures. This information is not intended to replace advice given to you by your health care provider. Make sure you discuss any questions you have with your health care provider. Document Revised: 04/08/2020 Document Reviewed: 04/08/2020 Elsevier Patient Education  2024 Elsevier Inc.  

## 2022-12-10 NOTE — Addendum Note (Signed)
Addended by: Shirley Muscat on: 12/10/2022 01:26 PM   Modules accepted: Orders

## 2023-03-25 NOTE — Progress Notes (Signed)
 PCP:  Smitty Cords, DO   Chief Complaint  Patient presents with   Gynecologic Exam   Urinary Tract Infection    Frequent urination,no burning x 2 days     HPI:      [name] is a 23 y.o. G1P1001 whose LMP was Patient's last menstrual period was 03/18/2023 (approximate)., presents today for her annual examination.  Her menses are regular every 28-30 days, lasting 4 days, mod flow.  Dysmenorrhea none, no BTB.  Sex activity: single partner, contraception - none. Conception ok, has been off BC a couple yrs. Taking PNVs.  Partner is not FOB; partner doesn't have any children. Pt is s/p CS 2018 Last Pap: 05/14/20 Results were: no abnormalities  Hx of STDs: none  There is a FH of breast cancer in her mat grt aunt and MGGM, genetic testing not indicated. There is no FH of ovarian cancer. The patient does do self-breast exams.  Tobacco use: vapes daily Alcohol use: social drinker No drug use.  Exercise: moderately active  She does get adequate calcium but not Vitamin D in her diet.  Has had urinary frequency with good flow recently, no dysuria/hematuria/LBP/pelvic pain/fevers/vag sx. Drinking good bit of caffeine. Hx of UTI in past.   Past Medical History:  Diagnosis Date   No pertinent past medical history     Past Surgical History:  Procedure Laterality Date   CESAREAN SECTION  09/2016   WISDOM TOOTH EXTRACTION      Family History  Problem Relation Age of Onset   Colon cancer Paternal Grandfather        30s   Breast cancer Other        30s   Breast cancer Maternal Great-grandmother        not sure of age    Social History   Socioeconomic History   Marital status: Single    Spouse name: Not on file   Number of children: Not on file   Years of education: Not on file   Highest education level: Not on file  Occupational History   Not on file  Tobacco Use   Smoking status: Never   Smokeless tobacco: Never  Vaping Use   Vaping status: Every  Day   Substances: Nicotine, Flavoring  Substance and Sexual Activity   Alcohol use: Yes    Comment: occasionally    Drug use: Not Currently    Types: Marijuana   Sexual activity: Yes    Birth control/protection: None  Other Topics Concern   Not on file  Social History Narrative   Not on file   Social Drivers of Health   Financial Resource Strain: Not on file  Food Insecurity: Not on file  Transportation Needs: Not on file  Physical Activity: Not on file  Stress: Not on file  Social Connections: Not on file  Intimate Partner Violence: Not on file    No current outpatient medications on file.     ROS:  Review of Systems  Constitutional:  Negative for fatigue, fever and unexpected weight change.  Respiratory:  Negative for cough, shortness of breath and wheezing.   Cardiovascular:  Negative for chest pain, palpitations and leg swelling.  Gastrointestinal:  Negative for blood in stool, constipation, diarrhea, nausea and vomiting.  Endocrine: Negative for cold intolerance, heat intolerance and polyuria.  Genitourinary:  Positive for frequency. Negative for dyspareunia, dysuria, flank pain, genital sores, hematuria, menstrual problem, pelvic pain, urgency, vaginal bleeding, vaginal discharge and vaginal pain.  Musculoskeletal:  Negative for back pain, joint swelling and myalgias.  Skin:  Negative for rash.  Neurological:  Negative for dizziness, syncope, light-headedness, numbness and headaches.  Hematological:  Negative for adenopathy.  Psychiatric/Behavioral:  Negative for agitation, confusion, sleep disturbance and suicidal ideas. The patient is not nervous/anxious.   BREAST: No symptoms   Objective: BP 100/65   Pulse (!) 58   Ht 5\' 6"  (1.676 m)   Wt 216 lb (98 kg)   LMP 03/18/2023 (Approximate)   BMI 34.86 kg/m    Physical Exam Constitutional:      Appearance: She is well-developed.  Genitourinary:     Vulva normal.     Right Labia: No rash, tenderness or  lesions.    Left Labia: No tenderness, lesions or rash.    No vaginal discharge, erythema or tenderness.      Right Adnexa: not tender and no mass present.    Left Adnexa: not tender and no mass present.    No cervical friability or polyp.     Uterus is not enlarged or tender.  Breasts:    Right: No mass, nipple discharge, skin change or tenderness.     Left: No mass, nipple discharge, skin change or tenderness.  Neck:     Thyroid: No thyromegaly.  Cardiovascular:     Rate and Rhythm: Normal rate and regular rhythm.     Heart sounds: Normal heart sounds. No murmur heard. Pulmonary:     Effort: Pulmonary effort is normal.     Breath sounds: Normal breath sounds.  Abdominal:     Palpations: Abdomen is soft.     Tenderness: There is no abdominal tenderness. There is no guarding or rebound.  Musculoskeletal:        General: Normal range of motion.     Cervical back: Normal range of motion.  Lymphadenopathy:     Cervical: No cervical adenopathy.  Neurological:     General: No focal deficit present.     Mental Status: She is alert and oriented to person, place, and time.     Cranial Nerves: No cranial nerve deficit.  Skin:    General: Skin is warm and dry.  Psychiatric:        Mood and Affect: Mood normal.        Behavior: Behavior normal.        Thought Content: Thought content normal.        Judgment: Judgment normal.  Vitals reviewed.     Assessment/Plan: Encounter for annual routine gynecological examination  Cervical cancer screening - Plan: Cytology - PAP  Screening for STD (sexually transmitted disease) - Plan: Cytology - PAP  Urinary frequency - Plan: POCT Urinalysis Dipstick; neg UA, no other sx. D/C caffeine, increase water. F/u prn.    GYN counsel adequate intake of calcium and vitamin D, diet and exercise, vaping cessation     F/U  Return in about 1 year (around 03/28/2024).  Crystallynn Noorani B. Charelle Petrakis, PA-C 03/29/2023 4:01 PM

## 2023-03-29 ENCOUNTER — Encounter: Payer: Self-pay | Admitting: Obstetrics and Gynecology

## 2023-03-29 ENCOUNTER — Other Ambulatory Visit (HOSPITAL_COMMUNITY)
Admission: RE | Admit: 2023-03-29 | Discharge: 2023-03-29 | Disposition: A | Discharge status: Home / Self Care | Payer: Medicaid Other | Source: Ambulatory Visit | Attending: Obstetrics and Gynecology | Admitting: Obstetrics and Gynecology

## 2023-03-29 ENCOUNTER — Ambulatory Visit (INDEPENDENT_AMBULATORY_CARE_PROVIDER_SITE_OTHER): Payer: Medicaid Other | Admitting: Obstetrics and Gynecology

## 2023-03-29 VITALS — BP 100/65 | HR 58 | Ht 66.0 in | Wt 216.0 lb

## 2023-03-29 DIAGNOSIS — Z01411 Encounter for gynecological examination (general) (routine) with abnormal findings: Secondary | ICD-10-CM

## 2023-03-29 DIAGNOSIS — Z124 Encounter for screening for malignant neoplasm of cervix: Secondary | ICD-10-CM | POA: Diagnosis not present

## 2023-03-29 DIAGNOSIS — Z01419 Encounter for gynecological examination (general) (routine) without abnormal findings: Secondary | ICD-10-CM

## 2023-03-29 DIAGNOSIS — Z113 Encounter for screening for infections with a predominantly sexual mode of transmission: Secondary | ICD-10-CM | POA: Diagnosis not present

## 2023-03-29 DIAGNOSIS — Z3169 Encounter for other general counseling and advice on procreation: Secondary | ICD-10-CM

## 2023-03-29 DIAGNOSIS — R350 Frequency of micturition: Secondary | ICD-10-CM | POA: Diagnosis not present

## 2023-03-29 LAB — POCT URINALYSIS DIPSTICK
Bilirubin, UA: NEGATIVE
Blood, UA: NEGATIVE
Glucose, UA: NEGATIVE
Ketones, UA: NEGATIVE
Leukocytes, UA: NEGATIVE
Nitrite, UA: NEGATIVE
Protein, UA: NEGATIVE
Spec Grav, UA: 1.01 (ref 1.010–1.025)
pH, UA: 7 (ref 5.0–8.0)

## 2023-03-29 NOTE — Patient Instructions (Signed)
 I value your feedback and you entrusting [place] with your care. If you get a [place] patient survey, I would appreciate you taking the time to let us know about your experience today. Thank you! ? ? ?

## 2023-03-31 LAB — CYTOLOGY - PAP
Adequacy: ABSENT
Chlamydia: NEGATIVE
Comment: NEGATIVE
Comment: NORMAL
Diagnosis: NEGATIVE
Neisseria Gonorrhea: NEGATIVE

## 2023-06-13 ENCOUNTER — Ambulatory Visit
Admission: EM | Admit: 2023-06-13 | Discharge: 2023-06-13 | Disposition: A | Discharge status: Home / Self Care | Attending: Emergency Medicine | Admitting: Emergency Medicine

## 2023-06-13 ENCOUNTER — Ambulatory Visit (INDEPENDENT_AMBULATORY_CARE_PROVIDER_SITE_OTHER)

## 2023-06-13 DIAGNOSIS — S93602A Unspecified sprain of left foot, initial encounter: Secondary | ICD-10-CM | POA: Diagnosis not present

## 2023-06-13 DIAGNOSIS — S93402A Sprain of unspecified ligament of left ankle, initial encounter: Secondary | ICD-10-CM | POA: Diagnosis not present

## 2023-06-13 DIAGNOSIS — M25572 Pain in left ankle and joints of left foot: Secondary | ICD-10-CM | POA: Diagnosis not present

## 2023-06-13 DIAGNOSIS — M79672 Pain in left foot: Secondary | ICD-10-CM | POA: Diagnosis not present

## 2023-06-13 DIAGNOSIS — M7989 Other specified soft tissue disorders: Secondary | ICD-10-CM | POA: Diagnosis not present

## 2023-06-13 MED ORDER — IBUPROFEN 600 MG PO TABS: 600.0000 mg | ORAL_TABLET | Freq: Four times a day (QID) | ORAL | 0 refills | Status: DC | PRN

## 2023-06-13 NOTE — ED Triage Notes (Signed)
 Patient fell out in the rain and twisted her left ankle

## 2023-06-13 NOTE — Discharge Instructions (Addendum)
 Wear the cam boot at all times when you are up and moving.  You may take it off to shower and when you go to bed at night.  Use the crutches to help you get around.  You may gradually attempt to bear weight on your left ankle in the boot as tolerated.  Keep your left foot elevated is much as possible to decrease pain and swelling.  You may remove the boot 2-3 times a day to apply ice to help with pain and swelling.  Take the ibuprofen 600 mg every 6 hours with food as needed for pain and inflammation.  I have referred you to podiatry for evaluation and monitoring to ensure that you are healing properly.  Given the degree of pain that you are in it is possible that you may have suffered a ligamentous injury which does not show up on x-ray.  If your symptoms or not getting better you may need to have an MRI which would have to be ordered by podiatry.

## 2023-06-13 NOTE — ED Provider Notes (Addendum)
 [facility]    CSN: [id number] Arrival date & time: 06/13/23  1328      History   Chief Complaint Chief Complaint  Patient presents with   Ankle Injury    HPI TYREESHA WAHL is a 23 y.o. female.   HPI  23 year old female with no significant past medical history presents for evaluation of pain and swelling to her left foot and ankle after suffering a ground-level fall last night in the rain.  She reports that she is unable to bear weight due to the pain and is experiencing numbness in her toes.  Past Medical History:  Diagnosis Date   No pertinent past medical history     There are no active problems to display for this patient.   Past Surgical History:  Procedure Laterality Date   CESAREAN SECTION  09/2016   WISDOM TOOTH EXTRACTION      OB History     Gravida  1   Para  1   Term  1   Preterm      AB      Living  1      SAB      IAB      Ectopic      Multiple      Live Births  1            Home Medications    Prior to Admission medications   Medication Sig Start Date End Date Taking? Authorizing Provider  ibuprofen (ADVIL) 600 MG tablet Take 1 tablet (600 mg total) by mouth every 6 (six) hours as needed. 06/13/23  Yes Kent Pear, NP  etonogestrel (NEXPLANON) 68 MG IMPL implant 1 each by Subdermal route once.  02/25/19  [provider]    Family History Family History  Problem Relation Age of Onset   Colon cancer Paternal Grandfather        30s   Breast cancer Other        30s   Breast cancer Maternal Great-grandmother        not sure of age    Social History Social History   Tobacco Use   Smoking status: Never   Smokeless tobacco: Never  Vaping Use   Vaping status: Every Day   Substances: Nicotine, Flavoring  Substance Use Topics   Alcohol use: Yes    Comment: occasionally    Drug use: Not Currently    Types: Marijuana     Allergies   Prednisone  and Hydrocodone   Review of Systems Review  of Systems  Musculoskeletal:  Positive for arthralgias and joint swelling.  Skin:  Positive for color change.  Neurological:  Positive for numbness.     Physical Exam Triage Vital Signs ED Triage Vitals  Encounter Vitals Group     BP      Systolic BP Percentile      Diastolic BP Percentile      Pulse      Resp      Temp      Temp src      SpO2      Weight      Height      Head Circumference      Peak Flow      Pain Score      Pain Loc      Pain Education      Exclude from Growth Chart    No data found.  Updated Vital Signs BP 116/74 (BP Location: Left Arm)  Pulse 75   Temp 97.9 F (36.6 C) (Oral)   Resp 14   LMP 06/09/2023 (Exact Date)   SpO2 96%   Visual Acuity Right Eye Distance:   Left Eye Distance:   Bilateral Distance:    Right Eye Near:   Left Eye Near:    Bilateral Near:     Physical Exam Vitals and nursing note reviewed.  Constitutional:      Appearance: Normal appearance. She is not ill-appearing.  HENT:     Head: Normocephalic and atraumatic.  Musculoskeletal:        General: Swelling, tenderness and signs of injury present. No deformity.  Skin:    General: Skin is warm and dry.     Capillary Refill: Capillary refill takes less than 2 seconds.     Findings: Bruising present.  Neurological:     General: No focal deficit present.     Mental Status: She is alert and oriented to person, place, and time.      UC Treatments / Results  Labs (all labs ordered are listed, but only abnormal results are displayed) Labs Reviewed - No data to display  EKG   Radiology No results found.  Procedures Procedures (including critical care time)  Medications Ordered in UC Medications - No data to display  Initial Impression / Assessment and Plan / UC Course  I have reviewed the triage vital signs and the nursing notes.  Pertinent labs & imaging results that were available during my care of the patient were reviewed by me and considered in  my medical decision making (see chart for details).   Patient is a, nontoxic-appearing 23 year old female presenting for evaluation of pain in her left foot and ankle after suffering a ground-level fall last night.  She reports that she turned her ankle and now is experiencing numbness in her toes and inability to bear weight.  Patient seen image above, there is some mild ecchymosis forming at the distal aspect of the foot proximal to the toes and there is edema over the lateral malleolus.  The patient has range of motion of her ankle though it is minimal due to pain.  She describes a numb sensation in her toes but she is able to wiggle them.  DP and PT pulses are 2+.  Cap refills less than 3 seconds.  She has pain with palpation of the proximal midfoot, lateral malleolus, and anterior ankle.  Medial malleolus, arch, and calcaneus are nontender to palpation.  I will obtain radiograph of the left foot ankle to rule out any bony abnormality.  Left ankle x-rays independently reviewed and evaluated by me.  Impression: No evidence of fracture or dislocation.  Ankle mortise is well-maintained.  Soft tissue swelling present over the lateral aspect of the ankle.  Radiology overread is pending. Radiology impression states no evidence of fracture, dislocation, or joint effusion.  Left foot x-rays independently reviewed and evaluated by me.  Impression: No evidence of fracture or dislocation.  Mild soft tissue swelling over the proximal and lateral midfoot.  Radiology overread is pending. Radiology impression states no evidence of fracture, dislocation, or arthropathy.  I will discharge patient home with a diagnosis of sprain of her left ankle and foot and put her in a cam boot for support and protection.  I will also have staff outfit her with crutches to help with ambulation until she is able to bear weight.  Additionally, I will refer her to podiatry for follow-up.    Final Clinical  Impressions(s) / UC  Diagnoses   Final diagnoses:  Pain in left ankle and joints of left foot  Sprain of left ankle, unspecified ligament, initial encounter  Foot sprain, left, initial encounter     Discharge Instructions      Wear the cam boot at all times when you are up and moving.  You may take it off to shower and when you go to bed at night.  Use the crutches to help you get around.  You may gradually attempt to bear weight on your left ankle in the boot as tolerated.  Keep your left foot elevated is much as possible to decrease pain and swelling.  You may remove the boot 2-3 times a day to apply ice to help with pain and swelling.  Take the ibuprofen 600 mg every 6 hours with food as needed for pain and inflammation.  I have referred you to podiatry for evaluation and monitoring to ensure that you are healing properly.  Given the degree of pain that you are in it is possible that you may have suffered a ligamentous injury which does not show up on x-ray.  If your symptoms or not getting better you may need to have an MRI which would have to be ordered by podiatry.     ED Prescriptions     Medication Sig Dispense Auth. Provider   ibuprofen (ADVIL) 600 MG tablet Take 1 tablet (600 mg total) by mouth every 6 (six) hours as needed. 30 tablet Kent Pear, NP      PDMP not reviewed this encounter.   Kent Pear, NP 06/13/23 1416    Kent Pear, NP 06/13/23 1510

## 2023-07-19 ENCOUNTER — Ambulatory Visit: Admitting: Podiatry

## 2023-07-19 ENCOUNTER — Ambulatory Visit (INDEPENDENT_AMBULATORY_CARE_PROVIDER_SITE_OTHER)

## 2023-07-19 ENCOUNTER — Encounter: Payer: Self-pay | Admitting: Podiatry

## 2023-07-19 DIAGNOSIS — M779 Enthesopathy, unspecified: Secondary | ICD-10-CM | POA: Diagnosis not present

## 2023-07-19 DIAGNOSIS — S86819A Strain of other muscle(s) and tendon(s) at lower leg level, unspecified leg, initial encounter: Secondary | ICD-10-CM | POA: Diagnosis not present

## 2023-07-19 DIAGNOSIS — S93409A Sprain of unspecified ligament of unspecified ankle, initial encounter: Secondary | ICD-10-CM

## 2023-07-19 MED ORDER — IBUPROFEN 800 MG PO TABS: 800.0000 mg | ORAL_TABLET | Freq: Three times a day (TID) | ORAL | 0 refills | Status: AC | PRN

## 2023-07-19 NOTE — Patient Instructions (Addendum)
 Call [facility] to schedule your MRI at the below locations.  Please allow at least 1 business day after your visit to process the referral.  It may take longer depending on approval from insurance.  Please let me know if you have issues or problems scheduling the MRI   [facility] [place] [phone] [address] [place] [address] [place], [place] [address]  [facility] [phone] [address] [place], [place] [address]     VISIT SUMMARY: Today, you were seen for persistent ankle pain following a severe sprain that occurred about a month ago. Despite initial treatment with a boot, you continue to experience pain and swelling, particularly on the lateral side of your ankle.  YOUR PLAN: -SEVERE ANKLE SPRAIN WITH POSSIBLE LATERAL ANKLE DISRUPTION: A severe ankle sprain involves significant stretching or tearing of the ligaments that support the ankle. Your pain and swelling suggest that there may be additional damage to the ligaments or other soft tissues. We will order an MRI to get a detailed look at the extent of the injury. Continue using the boot, especially during prolonged standing or walking, and take ibuprofen  800 mg up to three times daily with food and water. Ice your ankle at the end of the day if swelling occurs, and follow the RICE protocol: rest, ice, compression, and elevation.  -POSSIBLE PERONEAL TENDON TEAR: A peroneal tendon tear involves damage to the tendons that run along the outer side of your ankle. This can cause pain and instability. The MRI will help us  determine if there is any tendon damage, which may require surgical intervention.  INSTRUCTIONS: Please schedule an MRI of your ankle at [facility] as soon as possible. Follow up with us  in 3-4 weeks to reassess your condition and review the MRI results. If you have any issues with scheduling the MRI, contact our office  immediately.                      Contains text generated by [employer]bridge.                                 Contains text generated by [employer]bridge.

## 2023-07-20 NOTE — Progress Notes (Signed)
 Subjective:  Patient ID: Jennifer Bass, female    DOB: 29-Jun-2000,  MRN: [id number]  Chief Complaint  Patient presents with   Foot Pain    "I sprained my ankle on May 3.  I'm still having issues with it.  I get pain in back of my ankle, it still swells.  I can't rotate my foot in circles."    Discussed the use of AI scribe software for clinical note transcription with the patient, who gave verbal consent to proceed.  History of Present Illness Jennifer Bass is a 23 year old female who presents with persistent ankle pain following a sprain.  About a month ago, she rolled her ankle after slipping outside in the rain, resulting in a fall. The following day, she was unable to bear weight on the ankle and visited urgent care, where it was determined that the ankle was severely sprained but not fractured. She was placed in a boot for two weeks.  Since removing the boot, she reports that her ankle has not improved. Pain is primarily on the lateral aspect of the ankle, with noticeable swelling, especially after prolonged standing. She has difficulty moving the affected foot in circles compared to the unaffected foot. Initial bruising has resolved.  No pain near the knee or in the Achilles tendon. Soreness is noted in specific areas of the ankle, particularly during movements such as plantarflexion or dorsiflexion. Pain is also experienced when pushing the foot inwards against resistance.  She is currently taking ibuprofen  200 mg for pain relief, which provides some relief but is not completely effective. No other medications are being taken regularly.      Objective:    Physical Exam VASCULAR: DP and PT pulse palpable. Foot is warm and well-perfused. Capillary fill time is brisk. DERMATOLOGIC: Normal skin turgor, texture, and temperature. No open lesions, rashes, or ulcerations. NEUROLOGIC: Normal sensation to light touch and pressure. No paresthesias. ORTHOPEDIC: Pain on plantar flexion,  dorsiflexion, inversion, and anterior drawer test of the left ankle. No pain on eversion of the left ankle. Pain in ATFL, CFL, and posterior peroneals of the left ankle. No ecchymosis, bruising, or gross deformity. No pain to palpation of other examined joints.   No images are attached to the encounter.    Results RADIOLOGY Left foot and ankle radiographs: No fracture or stress fracture. Widening of the ankle mortise and syndesmosis with increased tibiofibular overlap. (07/19/2023)   Assessment:   1. Rupture of ligament of lateral aspect of ankle   2. Peroneal retinacular tear      Plan:  Patient was evaluated and treated and all questions answered.  Assessment and Plan Assessment & Plan Severe ankle sprain with possible lateral ankle disruption Severe ankle sprain with possible lateral ankle disruption. Injury occurred approximately one month ago after slipping in the rain. Persistent pain and swelling despite initial treatment with a boot for two weeks. Pain localized to the lateral aspect of the ankle with increased pain on inversion and anterior drawer test. No fractures on radiographs, but widening of the ankle mortis and syndesmosis with increased tib-fib overlap. Differential includes lateral ankle ligament disruption. MRI is recommended to evaluate for further soft tissue damage that may require intervention. - Order MRI of the ankle to evaluate for ligamentous and soft tissue damage, including peroneal tendon tear and ligament tear. - Continue use of the boot for immobilization, especially during prolonged standing or walking. - Prescribe ibuprofen  800 mg to be taken up to three times  daily with food and plenty of water to minimize gastrointestinal upset. - Advise icing the ankle at the end of the day if swelling occurs. - Review RICE protocol with emphasis on rest, ice, compression, and elevation.  Possible peroneal tendon tear Possible peroneal tendon tear. Pain in the  posterior peroneals in the retromalleolar groove. MRI is needed to assess for tendon damage. If MRI indicates tendon damage, surgical intervention may be considered.  Follow-up Follow-up in 3-4 weeks to reassess condition and review MRI results. MRI will be ordered as a stat study to expedite diagnosis and treatment planning. - Schedule follow-up appointment in 3-4 weeks. - Provide contact information for MRI scheduling at [facility]. - Advise to contact the office if there are any issues with MRI scheduling.      Return in about 4 weeks (around 08/16/2023) for after MRI to review.

## 2023-07-23 ENCOUNTER — Ambulatory Visit
Admission: RE | Admit: 2023-07-23 | Discharge: 2023-07-23 | Disposition: A | Discharge status: Home / Self Care | Source: Ambulatory Visit | Attending: Podiatry

## 2023-07-23 DIAGNOSIS — S86819A Strain of other muscle(s) and tendon(s) at lower leg level, unspecified leg, initial encounter: Secondary | ICD-10-CM

## 2023-07-23 DIAGNOSIS — S93409A Sprain of unspecified ligament of unspecified ankle, initial encounter: Secondary | ICD-10-CM

## 2023-07-23 DIAGNOSIS — S99912A Unspecified injury of left ankle, initial encounter: Secondary | ICD-10-CM | POA: Diagnosis not present

## 2023-08-09 ENCOUNTER — Ambulatory Visit: Payer: Self-pay | Admitting: Podiatry

## 2023-08-18 ENCOUNTER — Ambulatory Visit: Admitting: Podiatry

## 2023-08-25 ENCOUNTER — Ambulatory Visit: Admitting: Podiatry

## 2023-10-15 ENCOUNTER — Encounter: Payer: Self-pay | Admitting: Emergency Medicine

## 2023-10-15 ENCOUNTER — Ambulatory Visit
Admission: EM | Admit: 2023-10-15 | Discharge: 2023-10-15 | Disposition: A | Discharge status: Home / Self Care | Attending: Emergency Medicine | Admitting: Emergency Medicine

## 2023-10-15 ENCOUNTER — Ambulatory Visit (INDEPENDENT_AMBULATORY_CARE_PROVIDER_SITE_OTHER)

## 2023-10-15 ENCOUNTER — Ambulatory Visit (HOSPITAL_COMMUNITY): Payer: Self-pay

## 2023-10-15 DIAGNOSIS — S93502A Unspecified sprain of left great toe, initial encounter: Secondary | ICD-10-CM | POA: Diagnosis not present

## 2023-10-15 DIAGNOSIS — M25562 Pain in left knee: Secondary | ICD-10-CM

## 2023-10-15 DIAGNOSIS — S8002XA Contusion of left knee, initial encounter: Secondary | ICD-10-CM | POA: Diagnosis not present

## 2023-10-15 DIAGNOSIS — M79675 Pain in left toe(s): Secondary | ICD-10-CM

## 2023-10-15 DIAGNOSIS — S80212A Abrasion, left knee, initial encounter: Secondary | ICD-10-CM | POA: Diagnosis not present

## 2023-10-15 DIAGNOSIS — M7989 Other specified soft tissue disorders: Secondary | ICD-10-CM | POA: Diagnosis not present

## 2023-10-15 MED ORDER — IBUPROFEN 600 MG PO TABS: 600.0000 mg | ORAL_TABLET | Freq: Four times a day (QID) | ORAL | 0 refills | Status: AC | PRN

## 2023-10-15 MED ORDER — MUPIROCIN 2 % EX OINT: 1.0000 | TOPICAL_OINTMENT | Freq: Two times a day (BID) | CUTANEOUS | 0 refills | Status: AC

## 2023-10-15 NOTE — ED Triage Notes (Addendum)
 Pt states she went to a concert last night and fell on concrete. Pt c/o left knee, and left great toe pain. She has an abrasion on her left knee also. Pt states she did not hit her head.

## 2023-10-15 NOTE — ED Provider Notes (Addendum)
 [facility]    CSN: [id number] Arrival date & time: 10/15/23  1053      History   Chief Complaint Chief Complaint  Patient presents with   Knee Pain    left   Toe Pain    Left great toe   Fall    HPI Jennifer Bass is a 23 y.o. female.   HPI  23 year old female with past medical history significant for severe sprain of the left ankle presents for evaluation of pain in her left knee and left great toe.  She reports that she was running at a concert venue last night when she was bumped by another person causing her to fall and impact the concrete.  She denies striking her head or having a loss of consciousness.  She reports that she has to walk with her weight on her left heel.  Large abrasion to the left knee.  Tingling in her toes.  Past Medical History:  Diagnosis Date   No pertinent past medical history     There are no active problems to display for this patient.   Past Surgical History:  Procedure Laterality Date   CESAREAN SECTION  09/2016   WISDOM TOOTH EXTRACTION      OB History     Gravida  1   Para  1   Term  1   Preterm      AB      Living  1      SAB      IAB      Ectopic      Multiple      Live Births  1            Home Medications    Prior to Admission medications   Medication Sig Start Date End Date Taking? Authorizing Provider  ibuprofen  (ADVIL ) 600 MG tablet Take 1 tablet (600 mg total) by mouth every 6 (six) hours as needed. 10/15/23  Yes Bernardino Ditch, NP  mupirocin  ointment (BACTROBAN ) 2 % Apply 1 Application topically 2 (two) times daily. 10/15/23  Yes Bernardino Ditch, NP  etonogestrel (NEXPLANON) 68 MG IMPL implant 1 each by Subdermal route once.  02/25/19  [provider]    Family History Family History  Problem Relation Age of Onset   Colon cancer Paternal Grandfather        30s   Breast cancer Other        30s   Breast cancer Maternal Great-grandmother        not sure of age    Social  History Social History   Tobacco Use   Smoking status: Every Day    Types: E-cigarettes   Smokeless tobacco: Never  Vaping Use   Vaping status: Every Day   Substances: Nicotine, Flavoring  Substance Use Topics   Alcohol use: Yes    Comment: socally   Drug use: Not Currently    Types: Marijuana     Allergies   Prednisone  and Hydrocodone   Review of Systems Review of Systems  Musculoskeletal:  Positive for arthralgias and joint swelling.  Skin:  Positive for color change and wound.  Neurological:  Positive for numbness. Negative for weakness.     Physical Exam Triage Vital Signs ED Triage Vitals  Encounter Vitals Group     BP      Girls Systolic BP Percentile      Girls Diastolic BP Percentile      Boys Systolic BP Percentile  Boys Diastolic BP Percentile      Pulse      Resp      Temp      Temp src      SpO2      Weight      Height      Head Circumference      Peak Flow      Pain Score      Pain Loc      Pain Education      Exclude from Growth Chart    No data found.  Updated Vital Signs BP 113/77 (BP Location: Right Arm)   Pulse 67   Temp 98 F (36.7 C) (Oral)   Resp 16   Ht 5' 6 (1.676 m)   Wt 216 lb 0.8 oz (98 kg)   LMP 10/07/2023 (Approximate)   SpO2 96%   BMI 34.87 kg/m   Visual Acuity Right Eye Distance:   Left Eye Distance:   Bilateral Distance:    Right Eye Near:   Left Eye Near:    Bilateral Near:     Physical Exam Vitals and nursing note reviewed.  Constitutional:      Appearance: Normal appearance. She is not ill-appearing.  HENT:     Head: Normocephalic and atraumatic.  Musculoskeletal:        General: Swelling, tenderness and signs of injury present. No deformity. Normal range of motion.  Skin:    General: Skin is warm and dry.     Capillary Refill: Capillary refill takes less than 2 seconds.     Findings: Bruising present. No erythema.  Neurological:     General: No focal deficit present.     Mental Status:  She is alert and oriented to person, place, and time.      UC Treatments / Results  Labs (all labs ordered are listed, but only abnormal results are displayed) Labs Reviewed - No data to display  EKG   Radiology No results found.  Procedures Procedures (including critical care time)  Medications Ordered in UC Medications - No data to display  Initial Impression / Assessment and Plan / UC Course  I have reviewed the triage vital signs and the nursing notes.  Pertinent labs & imaging results that were available during my care of the patient were reviewed by me and considered in my medical decision making (see chart for details).   Patient is a very pleasant, nontoxic-appearing 23 year old female presenting for evaluation of pain and swelling to her left knee and left great toe as outlined in HPI above.  Patient seen and above, the patient has a large abrasion over her patella as well as over the tibial tuberosity.  No active bleeding.  There is marked edema over the tibial tuberosity and distal patellar tendon.  There is also pain slightly lateral to the tibial tuberosity.  She has no pain with palpation of the popliteal space or medial or lateral joint line.  She does have tenderness to palpation of the patella, patellar tendon, and tibial tuberosity.  Varus and valgus stress testing are unremarkable.  The patient also is experiencing pain in the proximal phalanx and MTP joint of the left great toe.  There is ecchymosis forming over the MTP joint.  Distal sensation is intact.  Patient has limited range of motion secondary to pain.  Cap refills less than 3 seconds.  I will obtain radiographs of the left knee and left great toe to evaluate for any bony abnormality.  Left knee x-ray independently reviewed and evaluated by me.  Impression: No evidence of fracture or dislocation.  Soft tissue swelling is present.  Radiology read is pending. Radiology impression states negative  exam.  Left great toe x-rays independently reviewed and evaluated by me.  Impression: No evidence of fracture or dislocation.  Soft tissue swelling is present.  Radiology overread is pending. Radiology impression states negative exam.  I will discharge patient home with a diagnosis of left great toe sprain, left knee contusion, and abrasion to the left knee.  I will prescribe topical mupirocin  that she can apply twice daily to the abrasions on her knee for the next 5 days to prevent infection.  She may use over-the-counter Tylenol  and/or ibuprofen  as needed for pain along with ice and elevation.  If her symptoms do not improve I will recommend she follow-up with orthopedics such as [facility] here in [place] or in [place].   Final Clinical Impressions(s) / UC Diagnoses   Final diagnoses:  Great toe pain, left  Acute pain of left knee  Sprain of great toe of left foot, initial encounter  Contusion of left knee, initial encounter  Abrasion of left knee, initial encounter     Discharge Instructions      Your x-rays did not show any broken bones.  I believe as a result of the fall you have sprained your toe, bruised your knee, and sustained the abrasion.  Keep the abrasion on your left knee clean and dry.  Apply mupirocin  ointment twice daily for the next 5 days to prevent infection to the abrasion.  Cover the abrasion with a nonstick dressing until a scab has formed.  Once the scab has formed you may leave the abrasion open to air when at home and cover with a dry dressing when you are out in public or when wearing pants.  Take the 600 mg ibuprofen  every 6 hours with food to help with pain and inflammation.  You may apply ice to both your knee and your toe for 20 minutes at a time, 2-3 times a day, to help with pain and inflammation.  Make sure you have a cloth between the ice and your knee to prevent skin damage.  Keep your knee and foot elevated is much as possible.  This will  help decrease swelling and aid in pain relief.  If your symptoms do not improve I would recommend that you follow-up with orthopedics for your knee and follow-up with podiatry, whom you have seen, for any ongoing issues with your big toe.     ED Prescriptions     Medication Sig Dispense Auth. Provider   ibuprofen  (ADVIL ) 600 MG tablet Take 1 tablet (600 mg total) by mouth every 6 (six) hours as needed. 30 tablet Bernardino Ditch, NP   mupirocin  ointment (BACTROBAN ) 2 % Apply 1 Application topically 2 (two) times daily. 22 g Bernardino Ditch, NP      PDMP not reviewed this encounter.   Bernardino Ditch, NP 10/15/23 1246    Bernardino Ditch, NP 10/15/23 1304

## 2023-10-15 NOTE — Discharge Instructions (Addendum)
 Your x-rays did not show any broken bones.  I believe as a result of the fall you have sprained your toe, bruised your knee, and sustained the abrasion.  Keep the abrasion on your left knee clean and dry.  Apply mupirocin  ointment twice daily for the next 5 days to prevent infection to the abrasion.  Cover the abrasion with a nonstick dressing until a scab has formed.  Once the scab has formed you may leave the abrasion open to air when at home and cover with a dry dressing when you are out in public or when wearing pants.  Take the 600 mg ibuprofen  every 6 hours with food to help with pain and inflammation.  You may apply ice to both your knee and your toe for 20 minutes at a time, 2-3 times a day, to help with pain and inflammation.  Make sure you have a cloth between the ice and your knee to prevent skin damage.  Keep your knee and foot elevated is much as possible.  This will help decrease swelling and aid in pain relief.  If your symptoms do not improve I would recommend that you follow-up with orthopedics for your knee and follow-up with podiatry, whom you have seen, for any ongoing issues with your big toe.
